# Patient Record
Sex: Male | Born: 1960 | Race: Black or African American | Hispanic: No | Marital: Married | Smoking: Never smoker
Health system: Southern US, Community
[De-identification: ages and names within clinical notes are randomized; demographics above are authoritative.]

## PROBLEM LIST (undated history)

## (undated) DIAGNOSIS — D8689 Sarcoidosis of other sites: Secondary | ICD-10-CM

## (undated) DIAGNOSIS — I2510 Atherosclerotic heart disease of native coronary artery without angina pectoris: Secondary | ICD-10-CM

## (undated) DIAGNOSIS — I214 Non-ST elevation (NSTEMI) myocardial infarction: Secondary | ICD-10-CM

## (undated) DIAGNOSIS — E876 Hypokalemia: Secondary | ICD-10-CM

## (undated) DIAGNOSIS — E785 Hyperlipidemia, unspecified: Secondary | ICD-10-CM

## (undated) DIAGNOSIS — K219 Gastro-esophageal reflux disease without esophagitis: Secondary | ICD-10-CM

## (undated) DIAGNOSIS — I10 Essential (primary) hypertension: Secondary | ICD-10-CM

## (undated) HISTORY — DX: Non-ST elevation (NSTEMI) myocardial infarction: I21.4

## (undated) HISTORY — DX: Hypokalemia: E87.6

## (undated) HISTORY — DX: Sarcoidosis of other sites: D86.89

## (undated) HISTORY — PX: KNEE SURGERY: SHX244

## (undated) HISTORY — PX: PERCUTANEOUS CORONARY STENT INTERVENTION (PCI-S): SHX6016

---

## 1999-06-06 ENCOUNTER — Other Ambulatory Visit: Admission: RE | Admit: 1999-06-06 | Discharge: 1999-06-06 | Payer: Self-pay | Admitting: Otolaryngology

## 1999-07-26 ENCOUNTER — Other Ambulatory Visit: Admission: RE | Admit: 1999-07-26 | Discharge: 1999-07-26 | Payer: Self-pay | Admitting: Otolaryngology

## 2002-08-02 ENCOUNTER — Emergency Department (HOSPITAL_COMMUNITY): Admission: EM | Admit: 2002-08-02 | Discharge: 2002-08-03 | Payer: Self-pay | Admitting: Emergency Medicine

## 2009-04-03 ENCOUNTER — Emergency Department (HOSPITAL_COMMUNITY): Admission: EM | Admit: 2009-04-03 | Discharge: 2009-04-03 | Payer: Self-pay | Admitting: Emergency Medicine

## 2010-12-10 ENCOUNTER — Encounter: Payer: Self-pay | Admitting: Family Medicine

## 2014-01-28 ENCOUNTER — Encounter (HOSPITAL_COMMUNITY): Payer: Self-pay | Admitting: Emergency Medicine

## 2014-01-28 ENCOUNTER — Encounter (HOSPITAL_COMMUNITY)
Admission: EM | Disposition: A | Discharge status: Home / Self Care | Payer: BC Managed Care – PPO | Source: Home / Self Care | Attending: Cardiology

## 2014-01-28 ENCOUNTER — Emergency Department (HOSPITAL_COMMUNITY): Payer: BC Managed Care – PPO

## 2014-01-28 ENCOUNTER — Inpatient Hospital Stay (HOSPITAL_COMMUNITY)
Admission: EM | Admit: 2014-01-28 | Discharge: 2014-01-30 | DRG: 247 | Disposition: A | Discharge status: Home / Self Care | Payer: BC Managed Care – PPO | Attending: Cardiology | Admitting: Cardiology

## 2014-01-28 DIAGNOSIS — Z79899 Other long term (current) drug therapy: Secondary | ICD-10-CM

## 2014-01-28 DIAGNOSIS — E876 Hypokalemia: Secondary | ICD-10-CM

## 2014-01-28 DIAGNOSIS — Z955 Presence of coronary angioplasty implant and graft: Secondary | ICD-10-CM

## 2014-01-28 DIAGNOSIS — I214 Non-ST elevation (NSTEMI) myocardial infarction: Secondary | ICD-10-CM

## 2014-01-28 DIAGNOSIS — R509 Fever, unspecified: Secondary | ICD-10-CM

## 2014-01-28 DIAGNOSIS — E785 Hyperlipidemia, unspecified: Secondary | ICD-10-CM | POA: Diagnosis present

## 2014-01-28 DIAGNOSIS — I2510 Atherosclerotic heart disease of native coronary artery without angina pectoris: Secondary | ICD-10-CM

## 2014-01-28 DIAGNOSIS — I252 Old myocardial infarction: Secondary | ICD-10-CM | POA: Diagnosis present

## 2014-01-28 DIAGNOSIS — I2582 Chronic total occlusion of coronary artery: Secondary | ICD-10-CM | POA: Diagnosis present

## 2014-01-28 DIAGNOSIS — I10 Essential (primary) hypertension: Secondary | ICD-10-CM

## 2014-01-28 HISTORY — DX: Atherosclerotic heart disease of native coronary artery without angina pectoris: I25.10

## 2014-01-28 HISTORY — DX: Essential (primary) hypertension: I10

## 2014-01-28 HISTORY — PX: LEFT HEART CATHETERIZATION WITH CORONARY ANGIOGRAM: SHX5451

## 2014-01-28 HISTORY — PX: PERCUTANEOUS STENT INTERVENTION: SHX5500

## 2014-01-28 HISTORY — DX: Hyperlipidemia, unspecified: E78.5

## 2014-01-28 LAB — I-STAT TROPONIN, ED
TROPONIN I, POC: 1.68 ng/mL — AB (ref 0.00–0.08)
Troponin i, poc: 2.42 ng/mL (ref 0.00–0.08)

## 2014-01-28 LAB — HEPARIN LEVEL (UNFRACTIONATED): Heparin Unfractionated: <0.1 IU/mL — ABNORMAL LOW (ref 0.30–0.70)

## 2014-01-28 LAB — COMPREHENSIVE METABOLIC PANEL
ALT: 38 U/L (ref 0–53)
AST: 75 U/L — ABNORMAL HIGH (ref 0–37)
Albumin: 4 g/dL (ref 3.5–5.2)
Alkaline Phosphatase: 86 U/L (ref 39–117)
BUN: 10 mg/dL (ref 6–23)
CALCIUM: 9.3 mg/dL (ref 8.4–10.5)
CO2: 26 mEq/L (ref 19–32)
Chloride: 98 mEq/L (ref 96–112)
Creatinine, Ser: 0.92 mg/dL (ref 0.50–1.35)
GFR calc non Af Amer: >90 mL/min (ref >90)
Glucose, Bld: 116 mg/dL — ABNORMAL HIGH (ref 70–99)
Potassium: 3.3 mEq/L — ABNORMAL LOW (ref 3.7–5.3)
Sodium: 138 mEq/L (ref 137–147)
TOTAL PROTEIN: 7.7 g/dL (ref 6.0–8.3)
Total Bilirubin: 1.3 mg/dL — ABNORMAL HIGH (ref 0.3–1.2)

## 2014-01-28 LAB — CBC
HCT: 44.1 % (ref 39.0–52.0)
Hemoglobin: 15.4 g/dL (ref 13.0–17.0)
MCH: 27.8 pg (ref 26.0–34.0)
MCHC: 34.9 g/dL (ref 30.0–36.0)
MCV: 79.6 fL (ref 78.0–100.0)
PLATELETS: 248 10*3/uL (ref 150–400)
RBC: 5.54 MIL/uL (ref 4.22–5.81)
RDW: 12.7 % (ref 11.5–15.5)
WBC: 4.9 10*3/uL (ref 4.0–10.5)

## 2014-01-28 LAB — RAPID URINE DRUG SCREEN, HOSP PERFORMED
AMPHETAMINES: NOT DETECTED
Barbiturates: NOT DETECTED
Benzodiazepines: POSITIVE — AB
COCAINE: NOT DETECTED
OPIATES: NOT DETECTED
Tetrahydrocannabinol: NOT DETECTED

## 2014-01-28 LAB — POCT ACTIVATED CLOTTING TIME: ACTIVATED CLOTTING TIME: 454 s

## 2014-01-28 LAB — URINALYSIS, ROUTINE W REFLEX MICROSCOPIC
BILIRUBIN URINE: NEGATIVE
Glucose, UA: NEGATIVE mg/dL
HGB URINE DIPSTICK: NEGATIVE
Ketones, ur: NEGATIVE mg/dL
Leukocytes, UA: NEGATIVE
NITRITE: NEGATIVE
PH: 6.5 (ref 5.0–8.0)
Protein, ur: NEGATIVE mg/dL
SPECIFIC GRAVITY, URINE: 1.008 (ref 1.005–1.030)
Urobilinogen, UA: 0.2 mg/dL (ref 0.0–1.0)

## 2014-01-28 LAB — APTT: APTT: 28 s (ref 24–37)

## 2014-01-28 LAB — MRSA PCR SCREENING: MRSA by PCR: NEGATIVE

## 2014-01-28 LAB — PROTIME-INR
INR: 1.01 (ref 0.00–1.49)
Prothrombin Time: 13.1 seconds (ref 11.6–15.2)

## 2014-01-28 SURGERY — LEFT HEART CATHETERIZATION WITH CORONARY ANGIOGRAM
Anesthesia: LOCAL

## 2014-01-28 MED ORDER — BIVALIRUDIN 250 MG IV SOLR
0.2500 mg/kg/h | INTRAVENOUS | Status: DC
  Administered 2014-01-28: 0.25 mg/kg/h via INTRAVENOUS
  Filled 2014-01-28: qty 250

## 2014-01-28 MED ORDER — TICAGRELOR 90 MG PO TABS
ORAL_TABLET | ORAL | Status: AC
  Filled 2014-01-28: qty 2

## 2014-01-28 MED ORDER — SODIUM CHLORIDE 0.9 % IV SOLN: 250.0000 mL | INTRAVENOUS | Status: DC | PRN

## 2014-01-28 MED ORDER — HEPARIN SODIUM (PORCINE) 5000 UNIT/ML IJ SOLN
4000.0000 [IU] | Freq: Once | INTRAMUSCULAR | Status: AC
  Administered 2014-01-28: 4000 [IU] via INTRAVENOUS
  Filled 2014-01-28: qty 1

## 2014-01-28 MED ORDER — HEPARIN (PORCINE) IN NACL 2-0.9 UNIT/ML-% IJ SOLN
INTRAMUSCULAR | Status: AC
  Filled 2014-01-28: qty 1500

## 2014-01-28 MED ORDER — BIVALIRUDIN 250 MG IV SOLR
INTRAVENOUS | Status: AC
  Filled 2014-01-28: qty 250

## 2014-01-28 MED ORDER — TICAGRELOR 90 MG PO TABS
90.0000 mg | ORAL_TABLET | Freq: Two times a day (BID) | ORAL | Status: DC
  Administered 2014-01-29 – 2014-01-30 (×3): 90 mg via ORAL
  Filled 2014-01-28 (×4): qty 1

## 2014-01-28 MED ORDER — SODIUM CHLORIDE 0.9 % IV SOLN: 1000.0000 mL | INTRAVENOUS | Status: DC

## 2014-01-28 MED ORDER — SODIUM CHLORIDE 0.9 % IV SOLN
INTRAVENOUS | Status: DC
  Administered 2014-01-28: 22:00:00 via INTRAVENOUS

## 2014-01-28 MED ORDER — NITROGLYCERIN 0.4 MG SL SUBL
0.4000 mg | SUBLINGUAL_TABLET | SUBLINGUAL | Status: DC | PRN
Start: 2014-01-28 — End: 2014-01-30

## 2014-01-28 MED ORDER — NITROGLYCERIN 0.2 MG/ML ON CALL CATH LAB
INTRAVENOUS | Status: AC
  Filled 2014-01-28: qty 1

## 2014-01-28 MED ORDER — ASPIRIN 81 MG PO CHEW: 81.0000 mg | CHEWABLE_TABLET | ORAL | Status: DC

## 2014-01-28 MED ORDER — NITROGLYCERIN IN D5W 200-5 MCG/ML-% IV SOLN
5.0000 ug/min | INTRAVENOUS | Status: DC
  Administered 2014-01-28: 5 ug/min via INTRAVENOUS
  Filled 2014-01-28: qty 250

## 2014-01-28 MED ORDER — SODIUM CHLORIDE 0.9 % IV SOLN
1000.0000 mL | INTRAVENOUS | Status: DC
  Administered 2014-01-28: 1000 mL via INTRAVENOUS

## 2014-01-28 MED ORDER — HEPARIN SODIUM (PORCINE) 1000 UNIT/ML IJ SOLN
INTRAMUSCULAR | Status: AC
Start: 2014-01-28 — End: 2014-01-28
  Filled 2014-01-28: qty 1

## 2014-01-28 MED ORDER — SODIUM CHLORIDE 0.9 % IJ SOLN: 3.0000 mL | INTRAMUSCULAR | Status: DC | PRN

## 2014-01-28 MED ORDER — METOPROLOL TARTRATE 25 MG PO TABS
25.0000 mg | ORAL_TABLET | Freq: Two times a day (BID) | ORAL | Status: DC
  Administered 2014-01-28 – 2014-01-30 (×4): 25 mg via ORAL
  Filled 2014-01-28 (×5): qty 1

## 2014-01-28 MED ORDER — MIDAZOLAM HCL 2 MG/2ML IJ SOLN
INTRAMUSCULAR | Status: AC
Start: 2014-01-28 — End: 2014-01-28
  Filled 2014-01-28: qty 2

## 2014-01-28 MED ORDER — FENTANYL CITRATE 0.05 MG/ML IJ SOLN
INTRAMUSCULAR | Status: AC
  Filled 2014-01-28: qty 2

## 2014-01-28 MED ORDER — ATORVASTATIN CALCIUM 80 MG PO TABS
80.0000 mg | ORAL_TABLET | Freq: Every day | ORAL | Status: DC
  Administered 2014-01-29: 80 mg via ORAL
  Filled 2014-01-28 (×2): qty 1

## 2014-01-28 MED ORDER — MIDAZOLAM HCL 2 MG/2ML IJ SOLN
INTRAMUSCULAR | Status: AC
  Filled 2014-01-28: qty 2

## 2014-01-28 MED ORDER — ASPIRIN EC 81 MG PO TBEC
81.0000 mg | DELAYED_RELEASE_TABLET | Freq: Every day | ORAL | Status: DC
Start: 2014-01-29 — End: 2014-01-30
  Administered 2014-01-29 – 2014-01-30 (×2): 81 mg via ORAL
  Filled 2014-01-28 (×2): qty 1

## 2014-01-28 MED ORDER — METOPROLOL TARTRATE 1 MG/ML IV SOLN
5.0000 mg | Freq: Once | INTRAVENOUS | Status: AC
  Administered 2014-01-28: 5 mg via INTRAVENOUS
  Filled 2014-01-28: qty 5

## 2014-01-28 MED ORDER — LIDOCAINE HCL (PF) 1 % IJ SOLN
INTRAMUSCULAR | Status: AC
  Filled 2014-01-28: qty 30

## 2014-01-28 MED ORDER — HEPARIN (PORCINE) IN NACL 100-0.45 UNIT/ML-% IJ SOLN: 14.0000 [IU]/kg/h | Freq: Once | INTRAMUSCULAR | Status: DC

## 2014-01-28 MED ORDER — POTASSIUM CHLORIDE 20 MEQ/15ML (10%) PO LIQD
ORAL | Status: AC
  Filled 2014-01-28: qty 30

## 2014-01-28 MED ORDER — HEPARIN (PORCINE) IN NACL 100-0.45 UNIT/ML-% IJ SOLN
1150.0000 [IU]/h | INTRAMUSCULAR | Status: DC
  Administered 2014-01-28: 1150 [IU]/h via INTRAVENOUS
  Filled 2014-01-28: qty 250

## 2014-01-28 MED ORDER — VERAPAMIL HCL 2.5 MG/ML IV SOLN
INTRAVENOUS | Status: AC
  Filled 2014-01-28: qty 2

## 2014-01-28 MED ORDER — HEPARIN (PORCINE) IN NACL 100-0.45 UNIT/ML-% IJ SOLN: 12.0000 [IU]/kg/h | INTRAMUSCULAR | Status: DC

## 2014-01-28 MED ORDER — NITROGLYCERIN 0.4 MG SL SUBL: 0.4000 mg | SUBLINGUAL_TABLET | SUBLINGUAL | Status: DC | PRN

## 2014-01-28 MED ORDER — ASPIRIN 81 MG PO CHEW
324.0000 mg | CHEWABLE_TABLET | Freq: Once | ORAL | Status: AC
  Administered 2014-01-28: 324 mg via ORAL
  Filled 2014-01-28: qty 4

## 2014-01-28 MED ORDER — POTASSIUM CHLORIDE CRYS ER 20 MEQ PO TBCR
40.0000 meq | EXTENDED_RELEASE_TABLET | Freq: Once | ORAL | Status: DC
  Filled 2014-01-28: qty 2

## 2014-01-28 MED ORDER — NITROGLYCERIN IN D5W 200-5 MCG/ML-% IV SOLN: 2.0000 ug/min | INTRAVENOUS | Status: DC

## 2014-01-28 MED ORDER — ASPIRIN EC 81 MG PO TBEC: 81.0000 mg | DELAYED_RELEASE_TABLET | Freq: Every day | ORAL | Status: DC

## 2014-01-28 MED ORDER — VERAPAMIL HCL 2.5 MG/ML IV SOLN
INTRAVENOUS | Status: AC
Start: 2014-01-28 — End: 2014-01-28
  Filled 2014-01-28: qty 2

## 2014-01-28 MED ORDER — SODIUM CHLORIDE 0.9 % IJ SOLN
3.0000 mL | Freq: Two times a day (BID) | INTRAMUSCULAR | Status: DC
Start: 2014-01-28 — End: 2014-01-28

## 2014-01-28 NOTE — Interval H&P Note (Signed)
Cath Lab Visit (complete for each Cath Lab visit)  Clinical Evaluation Leading to the Procedure:   ACS: yes  Non-ACS:    Anginal Classification: CCS IV  Anti-ischemic medical therapy: No Therapy  Non-Invasive Test Results: No non-invasive testing performed  Prior CABG: No previous CABG      History and Physical Interval Note:  01/28/2014 4:06 PM  Bryan Torres  has presented today for surgery, with the diagnosis of CP  The various methods of treatment have been discussed with the patient and family. After consideration of risks, benefits and other options for treatment, the patient has consented to  Procedure(s): LEFT HEART CATHETERIZATION WITH CORONARY ANGIOGRAM (N/A) as a surgical intervention .  The patient's history has been reviewed, patient examined, no change in status, stable for surgery.  I have reviewed the patient's chart and labs.  Questions were answered to the patient's satisfaction.     Bryan Torres

## 2014-01-28 NOTE — ED Notes (Signed)
Dr. Zackowski at bedside  

## 2014-01-28 NOTE — ED Notes (Signed)
Lab called to add on APTT and PT-INR to draw and hold light blue top. Shelia from lab sts has tube and will add on

## 2014-01-28 NOTE — CV Procedure (Addendum)
    Cardiac Catheterization Procedure Note  Name: Bryan Torres MRN: [id number] DOB: Sep 26, 1961  Procedure: Left Heart Cath, Selective Coronary Angiography, LV angiography  Indication: NSTEMI   Procedural Details: Allen's test was negative.  The right wrist was prepped, draped, and anesthetized with 1% lidocaine. Using the modified Seldinger technique, a 5 French sheath was introduced into the right radial artery. 3 mg of verapamil was administered through the sheath, weight-based unfractionated heparin was administered intravenously. Standard Judkins catheters were used for selective coronary angiography and left ventriculography. Catheter exchanges were performed over an exchange length guidewire. There were no immediate procedural complications. A TR band was used for radial hemostasis at the completion of the procedure.  The patient was transferred to the post catheterization recovery area for further monitoring.  Procedural Findings: Hemodynamics: AO 123/84 LV 131/22  Coronary angiography: Coronary dominance: codominant  Left mainstem: No significant disease.   Left anterior descending (LAD): Mild luminal irregularities.  Left circumflex (LCx): Codominant vessel.  Small ramus with 40% ostial narrowing. There was total occlusion of the distal LCx after a moderate PLOM.    Right coronary artery (RCA): Codominant vessel.  No significant disease.   Left ventriculography: Left ventricular systolic function is normal, LVEF is estimated at 60-65%, there was basal inferior severe hypokinesis.   Final Conclusions:  Culprit lesion is total occlusion of the distal LCx.  There is an associated wall motion abnormality.  I discussed patient with Dr. Claiborne Billings.  We will proceed with PCI after ticagrelor load.   Bryan Torres 01/28/2014, 5:20 PM

## 2014-01-28 NOTE — ED Notes (Signed)
Results of troponin given to Dr. Rogene Houston and shown to [place], [place]

## 2014-01-28 NOTE — ED Provider Notes (Addendum)
CSN: [id number]     Arrival date & time 01/28/14  1122 History   First MD Initiated Contact with Patient 01/28/14 1158     Chief Complaint  Patient presents with  . Chest Pain     (Consider location/radiation/quality/duration/timing/severity/associated sxs/prior Treatment) Patient is a 53 y.o. male presenting with chest pain. The history is provided by the patient.  Chest Pain Associated symptoms: nausea   Associated symptoms: no abdominal pain, no back pain, no fever, no headache, no shortness of breath and not vomiting    patient himself in. Patient with onset of chest pain last evening at 9:30 is been constant sternal left-sided chest nonradiating. Patient increased to 10 out of 10 at midnight currently 2/10. Associated with nausea but no vomiting no shortness of breath. No history of similar complaint. Patient felt that it was related to indigestion. Patient was having a lot of burping.  Past Medical History  Diagnosis Date  . Hypertension    Past Surgical History  Procedure Laterality Date  . Knee surgery Right     reconstructive   No family history on file. History  Substance Use Topics  . Smoking status: Never Smoker   . Smokeless tobacco: Not on file  . Alcohol Use: Yes     Comment: occ    Review of Systems  Constitutional: Negative for fever.  HENT: Negative for congestion.   Eyes: Negative for visual disturbance.  Respiratory: Negative for shortness of breath.   Cardiovascular: Positive for chest pain.  Gastrointestinal: Positive for nausea. Negative for vomiting and abdominal pain.  Genitourinary: Negative for dysuria.  Musculoskeletal: Negative for back pain.  Skin: Negative for rash.  Neurological: Negative for syncope and headaches.  Hematological: Does not bruise/bleed easily.  Psychiatric/Behavioral: Negative for confusion.      Allergies  Review of patient's allergies indicates no known allergies.  Home Medications  No current outpatient  prescriptions on file. BP 169/99  Pulse 67  Temp(Src) 99.1 F (37.3 C) (Oral)  Resp 18  Ht 5' 8.5" (1.74 m)  Wt 208 lb (94.348 kg)  BMI 31.16 kg/m2  SpO2 96% Physical Exam  Nursing note and vitals reviewed. Constitutional: He is oriented to person, place, and time. He appears well-developed and well-nourished. No distress.  HENT:  Head: Normocephalic and atraumatic.  Mouth/Throat: Oropharynx is clear and moist.  Eyes: Conjunctivae and EOM are normal.  Neck: Normal range of motion.  Cardiovascular: Normal rate, regular rhythm and normal heart sounds.   No murmur heard. Pulmonary/Chest: Effort normal and breath sounds normal. No respiratory distress.  Abdominal: Soft. Bowel sounds are normal. There is no tenderness.  Musculoskeletal: Normal range of motion. He exhibits no edema.  Neurological: He is alert and oriented to person, place, and time. No cranial nerve deficit. He exhibits normal muscle tone. Coordination normal.  Skin: Skin is warm.    ED Course  Procedures (including critical care time) [place], ED - Abnormal; Notable for the following:    Troponin i, poc 1.68 (*)    All other components within normal limits  CBC  COMPREHENSIVE METABOLIC PANEL  APTT  URINALYSIS, ROUTINE W REFLEX MICROSCOPIC  PROTIME-INR   Imaging Review Dg Chest 2 View  01/28/2014   CLINICAL DATA:  Epigastric chest pain  EXAM: CHEST  2 VIEW  COMPARISON:  None.  FINDINGS: The heart size and mediastinal contours are within normal limits. Both lungs are clear. The visualized skeletal structures are unremarkable.  IMPRESSION: No active  cardiopulmonary disease.   Electronically Signed   By: Jorje Guild M.D.   On: 01/28/2014 12:03     EKG Interpretation   Date/Time:  Thursday January 28 2014 11:27:35 EDT Ventricular Rate:  71 PR Interval:  168 QRS Duration: 80 QT Interval:  414 QTC Calculation: 449 R Axis:   82 Text Interpretation:  Normal sinus rhythm  with sinus arrhythmia T wave  abnormality, consider inferior ischemia Abnormal ECG Confirmed by  Yaniyah Koors  MD, Jamell Laymon ([id number]) on 01/28/2014 12:31:52 PM     Some artifact in the inferior leads but no distinct ST segment elevation. T wave inversion laterally.   CRITICAL CARE Performed by: Mervin Kung. Total critical care time: 30 Critical care time was exclusive of separately billable procedures and treating other patients. Critical care was necessary to treat or prevent imminent or life-threatening deterioration. Critical care was time spent personally by me on the following activities: development of treatment plan with patient and/or surrogate as well as nursing, discussions with consultants, evaluation of patient's response to treatment, examination of patient, obtaining history from patient or surrogate, ordering and performing treatments and interventions, ordering and review of laboratory studies, ordering and review of radiographic studies, pulse oximetry and re-evaluation of patient's condition.   MDM   Final diagnoses:  NSTEMI (non-ST elevated myocardial infarction)    Patient with onset of of substernal left-sided chest pain at 9:30 last evening when up to a 10 out of 10 at midnight. Currently 2/10. Patient is not any aspirin today. Patient with history of hypertension no coronary artery disease history. Patient's a nonsmoker. Family history negative for early coronary artery disease. Patient with persistent discomfort. Troponin elevated. EKG with T-wave inversion laterally some wandering baseline inferiorly but no evidence of ST segment elevation. Based on the troponin patient's a non-STEMI. IV started heparin started patient begin aspirin patient to get nitroglycerin. Cardiology contacted and they will be coming to see the patient.    Mervin Kung, MD 01/28/14 1255   I still awaiting cardiology. The patient stable. Patient's second 20 care troponin was 2.4 to. This  is doubled from the first one confirmatory for MI.  Mervin Kung, MD 01/28/14 [phone]

## 2014-01-28 NOTE — CV Procedure (Addendum)
Bryan Torres is a 53 y.o. male    [id number]  [id number] LOCATION:  FACILITY: [facility]  PHYSICIAN: Troy Sine, MD, [facility] 1961-08-11   DATE OF PROCEDURE:  01/28/2014    CARDIAC CATHETERIZATION     HISTORY:   Mr. Bryan Torres is a 53 year old African American male who developed new-onset chest pain last evening after playing tennis. He continued to experience recurrent chest pain today. He presented to [facility] and troponin was positive. Diagnostic catheterization was done via the right radial approach by Dr. Aundra Dubin. 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  I was consulted to attempt percutaneous coronary intervention.   PROCEDURE:  At the start of the interventional procedure, the patient received additional 1 mg of Versed and 25 mcg of fentanyl. Bivalrudin bolus plus infusion was administered and the patient received 180 mg of Brilinta upon completion of the catheterization study done by [facility]. The right radial sheath was in place from the diagnostic procedure. He received additional 3 mg verapamil to reduce potential spasm. Initially, a 6 [place] Vista guide XP 3.5 was inserted over a safety J. Wire but this was unable to be navigated to the descending aorta. This was then removed and replaced with a 5 [place] JR 4 over a first core wire and was advanced to the ascending aorta. This was then exchanged over a safety J. Wire for the exit B3 0.5 catheter. This was unable to cannulate the left main and ultimately was replaced with a JL 3.5. At this point, the patient was having additional chest pain and was noted to have ST elevation in his inferior leads. This catheter was able to selectively engage the left main and scattered angiography. Total occlusion of the AV groove circumflex after the second marginal branch. Unfortunately, this guide ultimately was unable to stay cannulated into the LAD. 2 additional attempts were made and a  Terumo L3 0.5, and XP LAD 3.5, which also were unsuccessful. A 3.5 guide was then reinserted and at this point there seemed to be intense spasm leading to difficulty in advancing the catheter despite  having previously received an additional 3 mg of verapamil and the catheter was removed. An additional 100 mcg intravenous nitroglycerin was also administered into the sheath. There was significant spasm over the remaining wire such that this was not able to be initially removed. At this point, the patient received an additional cocktail consisting of 200 mcg of nitroglycerin, 2 mg of verapamil and additional lidocaine. The decision was made to convert to the femoral approach. The right groin was prepped and shaved in the usual fashion. The right femoral artery was punctured anteriorly and a 6 French sheath was inserted without difficulty. Ultimately, with time and with the additional radial cocktails, the wire was able to be removed from the radial sheath successfully. A 6 French XB LAD 3.5 guide was ultimately use for the procedure from the femoral approach. In a stocking medium wire was advanced down the circumflex and was able to cross the total occlusion the dilatation was done with a 2.0x12 mm balloon just beyond the second major marginal branch. Once flow was reestablished there were 3 additional branch branches that were visualized including one larger marginal branch and an 2 more distal branches. 3 dilatations were done with a 2.0x12 mm Emerge. Due to the larger marginal branch arising a short distant so as not to jail this a Xience Alpine 2.25x8 mm DES stent was  inserted at deployed immediately beyond the second marginal branch and did not jail the now open third large marginal branch. This was dilated x2 at 10 and 11 atmospheres. A 2.5x6 mm noncompliant tract was used for post stent dilatation to 2.30 mm. Angiography confirmed an excellent angiographic result. He left the catheterization laboratory chest  pain-free with stable hemodynamics and resolution of his prior transient ST elevation.   HEMODYNAMICS:   Central Aorta: 120/80     ANGIOGRAPHY:  Please refer to Dr. Osie Cheeks diagnostic catheterization report.  At the start of the intervention, the left circumflex coronary artery is a dominant vessel that gave rise to 2 major obtuse marginal branches and then was occluded immediately after the second major marginal branch. Following successful PCI with additional 2.0x12 mm Amerge balloon and ultimate stenting 100% occlusion with a science Alpine 2.25 mm x8 mm DES stent, postdilated to 2.3 mm, the 100% occlusion was reduced to 0% there was resumption of TIMI 3 flow. Just beyond the stent there was a moderate size third marginal branch and then after this were 2 additional smaller branches supplying the posterolateral and inferolateral myocardium.  IMPRESSION:  Successful percutaneous coronary intervention the distal left circumflex coronary artery with the 100% occlusion of the AV groove after the second marginal vessel being reduced to 0% reestablishment of flow to 3 additional branches of the distal circumflex which were beyond this occluded segment.  Bivalirudin/180 mg oral Kelton Pillar, MD, [facility] 01/28/2014 9:25 PM

## 2014-01-28 NOTE — H&P (Signed)
Bryan Torres is an 53 y.o. male.   Chief Complaint:  Chest pain HPI:   The patientis a 53 yo AA male who works as a Information systems manager at [employer].  He has a history of HTN.  No tobacco abuse.  He reports playing a tennis match yesterday without difficulties but later that evening around 2130hrs, he developed "indigestion".  He was burping some, which decreased the discomfort.  He was also nauseated a little.  Around 2400hrs the pain became severe 8-10/10 with "tingling' in his left arm.  He took two 62m ASA and tums.  At 0200hrs the pain eased enough for him to fall asleep.  He woke to the continued chest pressure of 2-3/10.  He went to work and the pain increased.  Took advil and decided he better get it checked out.  The patient currently denies nausea, vomiting, fever, shortness of breath, orthopnea, dizziness, PND, cough, congestion, abdominal pain, hematochezia, melena, lower extremity edema, claudication.  Medications: Prior to Admission medications   Medication Sig Start Date End Date Taking? Authorizing Provider  hydrochlorothiazide (HYDRODIURIL) 25 MG tablet Take 12.5 mg by mouth daily.   Yes Historical Provider, MD  ibuprofen (ADVIL,MOTRIN) 200 MG tablet Take 200-400 mg by mouth every 6 (six) hours as needed for fever, headache or mild pain.   Yes Historical Provider, MD  Ranitidine HCl (ACID REDUCER PO) Take 1-2 tablets by mouth daily as needed (stomach acid).   Yes Historical Provider, MD     Past Medical History  Diagnosis Date  . Hypertension     Past Surgical History  Procedure Laterality Date  . Knee surgery Right     reconstructive    Family History:  No history of CAD  Social History:  reports that he has never smoked. He does not have any smokeless tobacco history on file. He reports that he drinks alcohol. He reports that he does not use illicit drugs.  Allergies: No Known Allergies   (Not in a hospital admission)  Results for orders placed during the  hospital encounter of 01/28/14 (from the past 48 hour(s))  CBC     Status: None   Collection Time    01/28/14 12:07 PM      Result Value Ref Range   WBC 4.9  4.0 - 10.5 K/uL   RBC 5.54  4.22 - 5.81 MIL/uL   Hemoglobin 15.4  13.0 - 17.0 g/dL   HCT 44.1  39.0 - 52.0 %   MCV 79.6  78.0 - 100.0 fL   MCH 27.8  26.0 - 34.0 pg   MCHC 34.9  30.0 - 36.0 g/dL   RDW 12.7  11.5 - 15.5 %   Platelets 248  150 - 400 K/uL  COMPREHENSIVE METABOLIC PANEL     Status: Abnormal   Collection Time    01/28/14 12:07 PM      Result Value Ref Range   Sodium 138  137 - 147 mEq/L   Potassium 3.3 (*) 3.7 - 5.3 mEq/L   Chloride 98  96 - 112 mEq/L   CO2 26  19 - 32 mEq/L   Glucose, Bld 116 (*) 70 - 99 mg/dL   BUN 10  6 - 23 mg/dL   Creatinine, Ser 0.92  0.50 - 1.35 mg/dL   Calcium 9.3  8.4 - 10.5 mg/dL   Total Protein 7.7  6.0 - 8.3 g/dL   Albumin 4.0  3.5 - 5.2 g/dL   AST 75 (*) 0 -  37 U/L   ALT 38  0 - 53 U/L   Alkaline Phosphatase 86  39 - 117 U/L   Total Bilirubin 1.3 (*) 0.3 - 1.2 mg/dL   GFR calc non Af Amer >90  >90 mL/min   GFR calc Af Amer >90  >90 mL/min   Comment: (NOTE)     The eGFR has been calculated using the CKD EPI equation.     This calculation has not been validated in all clinical situations.     eGFR's persistently <90 mL/min signify possible Chronic Kidney     Disease.  APTT     Status: None   Collection Time    01/28/14 12:07 PM      Result Value Ref Range   aPTT 28  24 - 37 seconds  PROTIME-INR     Status: None   Collection Time    01/28/14 12:07 PM      Result Value Ref Range   Prothrombin Time 13.1  11.6 - 15.2 seconds   INR 1.01  0.00 - 1.49  HEPARIN LEVEL (UNFRACTIONATED)     Status: Abnormal   Collection Time    01/28/14 12:07 PM      Result Value Ref Range   Heparin Unfractionated <0.10 (*) 0.30 - 0.70 IU/mL   Comment:            IF HEPARIN RESULTS ARE BELOW     EXPECTED VALUES, AND PATIENT     DOSAGE HAS BEEN CONFIRMED,     SUGGEST FOLLOW UP TESTING     OF  ANTITHROMBIN III LEVELS.     REPEATED TO VERIFY  I-STAT [place], ED     Status: Abnormal   Collection Time    01/28/14 12:14 PM      Result Value Ref Range   Troponin i, poc 1.68 (*) 0.00 - 0.08 ng/mL   Comment NOTIFIED PHYSICIAN     Comment 3            Comment: Due to the release kinetics of cTnI,     a negative result within the first hours     of the onset of symptoms does not rule out     myocardial infarction with certainty.     If myocardial infarction is still suspected,     repeat the test at appropriate intervals.  URINALYSIS, ROUTINE W REFLEX MICROSCOPIC     Status: None   Collection Time    01/28/14 12:48 PM      Result Value Ref Range   Color, Urine YELLOW  YELLOW   APPearance CLEAR  CLEAR   Specific Gravity, Urine 1.008  1.005 - 1.030   pH 6.5  5.0 - 8.0   Glucose, UA NEGATIVE  NEGATIVE mg/dL   Hgb urine dipstick NEGATIVE  NEGATIVE   Bilirubin Urine NEGATIVE  NEGATIVE   Ketones, ur NEGATIVE  NEGATIVE mg/dL   Protein, ur NEGATIVE  NEGATIVE mg/dL   Urobilinogen, UA 0.2  0.0 - 1.0 mg/dL   Nitrite NEGATIVE  NEGATIVE   Leukocytes, UA NEGATIVE  NEGATIVE   Comment: MICROSCOPIC NOT DONE ON URINES WITH NEGATIVE PROTEIN, BLOOD, LEUKOCYTES, NITRITE, OR GLUCOSE <1000 mg/dL.  Randolm Idol, ED     Status: Abnormal   Collection Time    01/28/14  1:52 PM      Result Value Ref Range   Troponin i, poc 2.42 (*) 0.00 - 0.08 ng/mL   Comment NOTIFIED PHYSICIAN     Comment 3  Comment: Due to the release kinetics of cTnI,     a negative result within the first hours     of the onset of symptoms does not rule out     myocardial infarction with certainty.     If myocardial infarction is still suspected,     repeat the test at appropriate intervals.   Dg Chest 2 View  01/28/2014   CLINICAL DATA:  Epigastric chest pain  EXAM: CHEST  2 VIEW  COMPARISON:  None.  FINDINGS: The heart size and mediastinal contours are within normal limits. Both lungs are clear. The  visualized skeletal structures are unremarkable.  IMPRESSION: No active cardiopulmonary disease.   Electronically Signed   By: Jorje Guild M.D.   On: 01/28/2014 12:03    Review of Systems  Constitutional: Negative for fever and diaphoresis.  HENT: Positive for sore throat. Negative for congestion.   Respiratory: Negative for cough, hemoptysis, shortness of breath and wheezing.   Cardiovascular: Positive for chest pain. Negative for palpitations, orthopnea, leg swelling and PND.  Gastrointestinal: Positive for nausea. Negative for vomiting, abdominal pain, blood in stool and melena.  Genitourinary: Negative for hematuria.  Musculoskeletal: Positive for myalgias (He reports "tingling" in the left arm).  Neurological: Negative for dizziness.  All other systems reviewed and are negative.    Blood pressure 142/85, pulse 68, temperature 99.1 F (37.3 C), temperature source Oral, resp. rate 19, height 5' 8.5" (1.74 m), weight 208 lb (94.348 kg), SpO2 99.00%. Physical Exam  Nursing note and vitals reviewed. Constitutional: He is oriented to person, place, and time. He appears well-developed and well-nourished. No distress.  HENT:  Head: Normocephalic and atraumatic.  Mouth/Throat: No oropharyngeal exudate.  Eyes: EOM are normal. Pupils are equal, round, and reactive to light. No scleral icterus.  Neck: Normal range of motion. Neck supple. No JVD present.  Cardiovascular: Normal rate, regular rhythm, S1 normal and S2 normal.   No murmur heard. Pulses:      Radial pulses are 2+ on the right side, and 2+ on the left side.       Dorsalis pedis pulses are 2+ on the right side, and 2+ on the left side.  No carotid Bruits.  Respiratory: Effort normal and breath sounds normal. He has no wheezes. He has no rales. He exhibits no tenderness.  GI: Soft. Bowel sounds are normal. He exhibits no distension. There is no tenderness.  Musculoskeletal: He exhibits no edema (No LEE).  Lymphadenopathy:     He has no cervical adenopathy.  Neurological: He is alert and oriented to person, place, and time. He exhibits normal muscle tone.  Skin: Skin is warm and dry.  Psychiatric: He has a normal mood and affect.     Assessment/Plan Principal Problem:   NSTEMI (non-ST elevated myocardial infarction) Active Problems:   HTN (hypertension)   Hypokalemia  Plan:  Second Troponin now 2.42.  Continue IV heparin and IV NTG.  Check lipids, TSH, A1C.  Replace K.  Left heart cath in AM.  2D echo.  Lopressor, liptor 3m.  HTarri Fuller3/10/2014, 2:31 PM  Patient seen with PA, agree with the above note.    53yo with history of HTN presents with NSTEMI.  He has no family history of premature CAD and does not smoke.  I do not know what his cholesterol is.  He exercises regularly.  He has had chest pain since last night, waxing and waning.  When it did not resolve today, he came  to the ER.  He still has 3/10 CP despite NTG gtt.  TnI is elevated.  He has inferior and anterolateral T wave inversions.  BP has been high.  - Start heparin gtt - Titrate NTG gtt for BP and chest pain.  - Will give IV metoprolol now with elevated BP.  - ASA, atorvastatin 80.  - Will plan LHC today.   [name] 01/28/2014 3:07 PM

## 2014-01-28 NOTE — ED Notes (Signed)
RN clarified Heparin with Dr. Rogene Houston sts ignore new order, and heparin is to go in at current rate.

## 2014-01-28 NOTE — ED Notes (Signed)
Pt c/o epigastric/L chest pain since 0130 while laying in bed.  Pt took "a few" aspirin and gingerale, started burping and felt relief.  Pt woke  Up, at a sandwich, played tennis, and ate another sandwich.  States pain increased after eating sandwich.  Pt non-diaphoretic, but feels nauseated and feels "like I need to burp".

## 2014-01-28 NOTE — ED Notes (Addendum)
Pt requesting food- dr. Rogene Houston made aware sts would prefer to wait for cardiology to see patient, but if patient is insistent he can eat. Pt made aware. RN gave pt. gingerale and saltines for now. Heart Healthy Meal tray ordered.

## 2014-01-28 NOTE — ED Notes (Signed)
RN re-informed pt of troponin results, admission, and troponin work-up. Pt in NAD, denies pain at present time. Pt has additional questions about plan and discharge RN made [facility] ([facility] MD aware.

## 2014-01-28 NOTE — ED Notes (Addendum)
Bryan Torres with cardiology at bedside

## 2014-01-28 NOTE — Progress Notes (Signed)
ANTICOAGULATION CONSULT NOTE - Initial Consult  Pharmacy Consult for Heparin Indication: chest pain/ACS  No Known Allergies  Patient Measurements: Height: 5' 8.5" (174 cm) Weight: 208 lb (94.348 kg) IBW/kg (Calculated) : 69.55 Heparin Dosing Weight:  89 kg  Vital Signs: Temp: 99.1 F (37.3 C) (03/12 1129) Temp src: Oral (03/12 1129) BP: 169/99 mmHg (03/12 1133) Pulse Rate: 67 (03/12 1129)  Labs: No results found for this basename: HGB, HCT, PLT, APTT, LABPROT, INR, HEPARINUNFRC, CREATININE, CKTOTAL, CKMB, TROPONINI,  in the last 72 hours  CrCl is unknown because no creatinine reading has been taken.   Medical History: Past Medical History  Diagnosis Date  . Hypertension     Medications:  HCTZ, Ibuprofen, Zantac  Assessment: Epigastric and L chest pain  53 y/o M with no significant PMH except HTN presents with CP that developed overnight. Troponin 1.68. Admit labs pending  Baseline labs: Scr 0.92, Hgb 15.4. Plts 248. Baseline INR 1.01.  Goal of Therapy:  Heparin level 0.3-0.7 units/ml Monitor platelets by anticoagulation protocol: Yes   Plan:  Heparin 4000 unit IV bolus Heparin infusion 1150 units/hr as ordered by ED MD at 12 units/kg/hr. Check heparin level in 6-8 hrs and daily.   Kristain Filo S. Alford Highland, PharmD, BCPS Clinical Staff Pharmacist Pager [phone]  Eilene Ghazi Stillinger 01/28/2014,12:46 PM

## 2014-01-28 NOTE — ED Notes (Signed)
Pharmacy notified to send Heparin

## 2014-01-28 NOTE — ED Notes (Addendum)
[facility]-Cardiologist at bedside.

## 2014-01-29 ENCOUNTER — Inpatient Hospital Stay (HOSPITAL_COMMUNITY): Payer: BC Managed Care – PPO

## 2014-01-29 LAB — BASIC METABOLIC PANEL
BUN: 9 mg/dL (ref 6–23)
CALCIUM: 8.2 mg/dL — AB (ref 8.4–10.5)
CO2: 24 mEq/L (ref 19–32)
Chloride: 101 mEq/L (ref 96–112)
Creatinine, Ser: 1.09 mg/dL (ref 0.50–1.35)
GFR calc non Af Amer: 76 mL/min — ABNORMAL LOW (ref >90)
GFR, EST AFRICAN AMERICAN: 88 mL/min — AB (ref >90)
GLUCOSE: 113 mg/dL — AB (ref 70–99)
Potassium: 3.3 mEq/L — ABNORMAL LOW (ref 3.7–5.3)
SODIUM: 140 meq/L (ref 137–147)

## 2014-01-29 LAB — LIPID PANEL
Cholesterol: 149 mg/dL (ref 0–200)
HDL: 42 mg/dL (ref >39)
LDL Cholesterol: 92 mg/dL (ref 0–99)
Total CHOL/HDL Ratio: 3.5 RATIO
Triglycerides: 74 mg/dL (ref <150)
VLDL: 15 mg/dL (ref 0–40)

## 2014-01-29 LAB — URINALYSIS, ROUTINE W REFLEX MICROSCOPIC
BILIRUBIN URINE: NEGATIVE
Glucose, UA: NEGATIVE mg/dL
Hgb urine dipstick: NEGATIVE
KETONES UR: NEGATIVE mg/dL
LEUKOCYTES UA: NEGATIVE
NITRITE: NEGATIVE
Protein, ur: NEGATIVE mg/dL
Specific Gravity, Urine: 1.01 (ref 1.005–1.030)
Urobilinogen, UA: 0.2 mg/dL (ref 0.0–1.0)
pH: 6.5 (ref 5.0–8.0)

## 2014-01-29 LAB — HEMOGLOBIN A1C
Hgb A1c MFr Bld: 6 % — ABNORMAL HIGH (ref <5.7)
MEAN PLASMA GLUCOSE: 126 mg/dL — AB (ref <117)

## 2014-01-29 LAB — CBC
HCT: 36.2 % — ABNORMAL LOW (ref 39.0–52.0)
HEMOGLOBIN: 12.3 g/dL — AB (ref 13.0–17.0)
MCH: 27.2 pg (ref 26.0–34.0)
MCHC: 34 g/dL (ref 30.0–36.0)
MCV: 80.1 fL (ref 78.0–100.0)
Platelets: 210 10*3/uL (ref 150–400)
RBC: 4.52 MIL/uL (ref 4.22–5.81)
RDW: 13 % (ref 11.5–15.5)
WBC: 5.8 10*3/uL (ref 4.0–10.5)

## 2014-01-29 LAB — TROPONIN I
Troponin I: >20 ng/mL (ref <0.30)
Troponin I: >20 ng/mL (ref <0.30)
Troponin I: >20 ng/mL (ref <0.30)

## 2014-01-29 LAB — TSH: TSH: 0.39 u[IU]/mL (ref 0.350–4.500)

## 2014-01-29 MED ORDER — POTASSIUM CHLORIDE CRYS ER 20 MEQ PO TBCR
40.0000 meq | EXTENDED_RELEASE_TABLET | Freq: Once | ORAL | Status: AC
  Administered 2014-01-29: 40 meq via ORAL
  Filled 2014-01-29: qty 2

## 2014-01-29 MED ORDER — ATROPINE SULFATE 0.1 MG/ML IJ SOLN
INTRAMUSCULAR | Status: AC
  Filled 2014-01-29: qty 10

## 2014-01-29 MED FILL — Sodium Chloride IV Soln 0.9%: INTRAVENOUS | Qty: 50 | Status: AC

## 2014-01-29 NOTE — Progress Notes (Signed)
Utilization review completed. Cinda Hara, RN, BSN. 

## 2014-01-29 NOTE — Progress Notes (Addendum)
SUBJECTIVE:  No further CP  OBJECTIVE:   Vitals:   Filed Vitals:   01/29/14 0302 01/29/14 0400 01/29/14 0500 01/29/14 0600  BP: 118/66 117/58 107/57 128/65  Pulse: 93 72 73 72  Temp:  100.2 F (37.9 C)    TempSrc:  Oral    Resp: 22 22 18 22   Height:      Weight:      SpO2: 100% 96% 97% 97%   I&O's:   Intake/Output Summary (Last 24 hours) at 01/29/14 0655 Last data filed at 01/29/14 0500  Gross per 24 hour  Intake 2733.93 ml  Output   1000 ml  Net 1733.93 ml   TELEMETRY: Reviewed telemetry pt in NSR:     PHYSICAL EXAM General: Well developed, well nourished, in no acute distress Head: Eyes PERRLA, No xanthomas.   Normal cephalic and atramatic  Lungs:   Clear bilaterally to auscultation and percussion. Heart:   HRRR S1 S2 Pulses are 2+ & equal. Abdomen: Bowel sounds are positive, abdomen soft and non-tender without masses Extremities:   No clubbing, cyanosis or edema.  DP +1 Neuro: Alert and oriented X 3. Psych:  Good affect, responds appropriately   LABS: Basic Metabolic Panel:  Recent Labs  01/28/14 1207 01/29/14 0410  NA 138 140  K 3.3* 3.3*  CL 98 101  CO2 26 24  GLUCOSE 116* 113*  BUN 10 9  CREATININE 0.92 1.09  CALCIUM 9.3 8.2*   Liver Function Tests:  Recent Labs  01/28/14 1207  AST 75*  ALT 38  ALKPHOS 86  BILITOT 1.3*  PROT 7.7  ALBUMIN 4.0   No results found for this basename: LIPASE, AMYLASE,  in the last 72 hours CBC:  Recent Labs  01/28/14 1207 01/29/14 0410  WBC 4.9 5.8  HGB 15.4 12.3*  HCT 44.1 36.2*  MCV 79.6 80.1  PLT 248 210   Cardiac Enzymes:  Recent Labs  01/28/14 2339 01/29/14 0410  TROPONINI >20.00* >20.00*   BNP: No components found with this basename: POCBNP,  D-Dimer: No results found for this basename: DDIMER,  in the last 72 hours Hemoglobin A1C: No results found for this basename: HGBA1C,  in the last 72 hours Fasting Lipid Panel:  Recent Labs  01/29/14 0410  CHOL 149  HDL 42  LDLCALC 92    TRIG 74  CHOLHDL 3.5   Thyroid Function Tests: No results found for this basename: TSH, T4TOTAL, FREET3, T3FREE, THYROIDAB,  in the last 72 hours Anemia Panel: No results found for this basename: VITAMINB12, FOLATE, FERRITIN, TIBC, IRON, RETICCTPCT,  in the last 72 hours Coag Panel:   Lab Results  Component Value Date   INR 1.01 01/28/2014    RADIOLOGY: Dg Chest 2 View  01/28/2014   CLINICAL DATA:  Epigastric chest pain  EXAM: CHEST  2 VIEW  COMPARISON:  None.  FINDINGS: The heart size and mediastinal contours are within normal limits. Both lungs are clear. The visualized skeletal structures are unremarkable.  IMPRESSION: No active cardiopulmonary disease.   Electronically Signed   By: Jorje Guild M.D.   On: 01/28/2014 12:03    Principal Problem:  NSTEMI (non-ST elevated myocardial infarction)  Active Problems:  HTN (hypertension)  Hypokalemia  ASCAD  Assessment/Plan  1.  NSTEMI due to occluded distal LCx - continue to cycle enzymes until they peak -ASA/Brilinta/statin/beta blocker - transfer to tele bed - d/c IV NTG gtt 2.  ASCAD s/p cath with 40% ostial small ramus, total occlusion of distal LCx  and patent RCA  s/p PCI of the distal left circ 3.  Normal LVF EF 60-65% with basal inferior severe HK Plan: Second Troponin now 2.42. Continue IV heparin and IV NTG. Check lipids, TSH, A1C. Replace K. Left heart cath in AM. 2D echo. Lopressor, liptor 80mg . 4.  Hypokalemia - he was on HCTZ at home which was not ordered on admit.  BP controlled on just beta blocker so will not reorder at this time.  Will replete with 37meq of Kdur and check BMET in am 5.  Fever ? Etiology - check PA and Lat chest xray - check UA - check 2 sets of blood cultures  Probable D/C Sat am     Sueanne Margarita, MD  01/29/2014  6:55 AM

## 2014-01-29 NOTE — Care Management Note (Unsigned)
    Page 1 of 1   01/29/2014     2:19:58 PM   CARE MANAGEMENT NOTE 01/29/2014  Patient:  ANANTH, FIALLOS   Account Number:  [id number]  Date Initiated:  01/29/2014  Documentation initiated by:  [facility]  Subjective/Objective Assessment:   Admitted with NSTEMI     Action/Plan:   Anticipated DC Date:  02/01/2014   Anticipated DC Plan:  [place]  CM consult      Choice offered to / List presented to:             Status of service:  In process, will continue to follow Medicare Important Message given?   (If response is "NO", the following Medicare IM given date fields will be blank) Date Medicare IM given:   Date Additional Medicare IM given:    Discharge Disposition:    Per UR Regulation:  Reviewed for med. necessity/level of care/duration of stay  If discussed at Long Length of Stay Meetings, dates discussed:    Comments:  ContactJoaquin Music Mother [phone]  01-29-14 [address], [place] [phone] Per benefits check-PT DOES NOT HAVE A COINSURANCE-PT DEDUCTIBLE IS $3,000 PT HAS NOT MET DEDUCTABLE. CM did provide pt with a 30 day free card. Pt will need Rx for 30 day free no refills and the original with refills. Pt will have to pay full price until deductible has been met.

## 2014-01-29 NOTE — Progress Notes (Signed)
CARDIAC REHAB PHASE I   PRE:  Rate/Rhythm: 72 SR  BP:  Supine: 124/92  Sitting:   Standing:    SaO2: 99 RA  MODE:  Ambulation: 550 ft   POST:  Rate/Rhythm: 64  BP:  Supine:   Sitting: 138/79  Standing:    SaO2: 100 RA 1130-1245 On arrival pt in bed , sleeping. Pt arouses easily. Pt tolerated ambulation well without c/o of cp or SOB. VS stable. Completed MI and stent education with pt. Pt was sleepy during education. His mother and wife were also there for education. Pt agrees to [facility]. CRP in [place], will send referral. I placed Heart Attack video on for pt and family to watch. Will follow pt tomorrow to see if he understands education provided.  Rodney Langton RN 01/29/2014 1:25 PM

## 2014-01-30 ENCOUNTER — Encounter (HOSPITAL_COMMUNITY): Payer: Self-pay | Admitting: Physician Assistant

## 2014-01-30 DIAGNOSIS — I2510 Atherosclerotic heart disease of native coronary artery without angina pectoris: Secondary | ICD-10-CM

## 2014-01-30 DIAGNOSIS — E876 Hypokalemia: Secondary | ICD-10-CM

## 2014-01-30 DIAGNOSIS — E785 Hyperlipidemia, unspecified: Secondary | ICD-10-CM

## 2014-01-30 DIAGNOSIS — R509 Fever, unspecified: Secondary | ICD-10-CM

## 2014-01-30 LAB — BASIC METABOLIC PANEL
BUN: 11 mg/dL (ref 6–23)
CO2: 26 mEq/L (ref 19–32)
CREATININE: 1.16 mg/dL (ref 0.50–1.35)
Calcium: 8.6 mg/dL (ref 8.4–10.5)
Chloride: 103 mEq/L (ref 96–112)
GFR, EST AFRICAN AMERICAN: 82 mL/min — AB (ref >90)
GFR, EST NON AFRICAN AMERICAN: 71 mL/min — AB (ref >90)
Glucose, Bld: 94 mg/dL (ref 70–99)
Potassium: 3.4 mEq/L — ABNORMAL LOW (ref 3.7–5.3)
Sodium: 141 mEq/L (ref 137–147)

## 2014-01-30 MED ORDER — NITROGLYCERIN 0.4 MG SL SUBL: 0.4000 mg | SUBLINGUAL_TABLET | SUBLINGUAL | Status: DC | PRN

## 2014-01-30 MED ORDER — POTASSIUM CHLORIDE CRYS ER 20 MEQ PO TBCR
20.0000 meq | EXTENDED_RELEASE_TABLET | Freq: Every day | ORAL | Status: DC
Start: 2014-01-30 — End: 2014-01-30
  Administered 2014-01-30: 20 meq via ORAL
  Filled 2014-01-30: qty 1

## 2014-01-30 MED ORDER — TICAGRELOR 90 MG PO TABS: 90.0000 mg | ORAL_TABLET | Freq: Two times a day (BID) | ORAL | Status: DC

## 2014-01-30 MED ORDER — ASPIRIN 81 MG PO TBEC: 81.0000 mg | DELAYED_RELEASE_TABLET | Freq: Every day | ORAL | Status: DC

## 2014-01-30 MED ORDER — POTASSIUM CHLORIDE CRYS ER 20 MEQ PO TBCR: 20.0000 meq | EXTENDED_RELEASE_TABLET | Freq: Every day | ORAL | Status: DC

## 2014-01-30 MED ORDER — ATORVASTATIN CALCIUM 80 MG PO TABS: 80.0000 mg | ORAL_TABLET | Freq: Every day | ORAL | Status: DC

## 2014-01-30 MED ORDER — HYDROCHLOROTHIAZIDE 12.5 MG PO CAPS
12.5000 mg | ORAL_CAPSULE | Freq: Every day | ORAL | Status: DC
  Administered 2014-01-30: 12.5 mg via ORAL
  Filled 2014-01-30: qty 1

## 2014-01-30 MED ORDER — METOPROLOL TARTRATE 25 MG PO TABS: 25.0000 mg | ORAL_TABLET | Freq: Two times a day (BID) | ORAL | Status: DC

## 2014-01-30 NOTE — Progress Notes (Signed)
[id number] Cardiac Rehab Reviewed MI and stent education with pt. He was very sleepy yesterday. He voices understanding. Encouraged pt to work on things that cause him stress. Put MI and Stent videos on for pt and wife to view. Deon Pilling, RN 01/30/2014 10:35 AM

## 2014-01-30 NOTE — Discharge Summary (Signed)
Discharge Summary   Patient ID: Bryan Torres,  MRN: [id number], DOB/AGE: 53/15/1962 53 y.o.  Admit date: 01/28/2014 Discharge date: 01/30/2014  Primary Physician: No primary provider on file. Primary Cardiologist: Henrene Dodge, MD  Discharge Diagnoses Principal Problem:   NSTEMI (non-ST elevated myocardial infarction)  - S/p DES-100% distal LCx on 01/28/14; nonobstructive dz elsewhere, EF 60-65, basal inferior severe HK  - DAPT- ASA/Brilinta x 12 months Active Problems:   CAD (coronary artery disease), native coronary artery  - As above  - Discharged on ASA, Brilinta, Lopressor, Lipitor, NTG SL PRN   HTN (hypertension)  - Resumed on HCTZ + KCl   Hyperlipidemia  - Discharged on high-potency atorvastatin 80 (ASCVD)  - LDL 92, HDL 42, TG 74, TC 149   Fever  - On AM of 3/13  - U/a w/o evidence of UTI  - Repeat CXR w/o evidence of PNA/bronchitis  - Blood cultures x 2 NGTD  - Resolved around noon, never recurred. Question in the setting of MI. 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   Hypokalemia  - Mild on admission  - Discharged w/ potassium supplementation  Allergies No Known Allergies  DIAGNOSTIC STUDIES/PROCEDURES  PA/LATERAL CHEST X-RAY - 01/28/14  IMPRESSION:  No active cardiopulmonary disease.  CARDIAC CATHETERIZATION - 01/28/14  Hemodynamics:  AO 123/84  LV 131/22  Coronary angiography:  Coronary dominance: codominant  Left mainstem: No significant disease.  Left anterior descending (LAD): Mild luminal irregularities.  Left circumflex (LCx): Codominant vessel. Small ramus with 40% ostial narrowing. There was total occlusion of the distal LCx after a moderate PLOM.  Right coronary artery (RCA): Codominant vessel. No significant disease.  Left ventriculography: Left ventricular systolic function is normal, LVEF is estimated at 60-65%, there was basal inferior severe hypokinesis.   PERCUTANEOUS CORONARY INTERVENTION -  01/28/14  IMPRESSION:  Successful percutaneous coronary intervention the distal left circumflex coronary artery with the 100% occlusion of the AV groove after the second marginal vessel being reduced to 0% reestablishment of flow to 3 additional branches of the distal circumflex which were beyond this occluded segment.  PA/LATERAL CHEST X-RAY - 01/29/14  IMPRESSION:  1. Cannot exclude small right pleural effusion.  2. Exam otherwise unremarkable. No focal infiltrate noted.  History of Present Illness  Bryan Torres is a 53 y.o. male who was admitted at [facility] from 3/12 to 3/14 with the above problem list.   He has no prior cardiac history. Endorsed a h/o HTN. He reported experiencing chest discomfort the evening prior to admission which he attributed to indigestion. He had associated belching and nausea. He took two ASA 81mg  and tums. This resolved, but then recurred later that evening at a 8/10 intensity and L arm tingling. He took advil with some relief, however the pain recurred once more and he presented to [facility] ED for further evaluation.  Hospital Course   There, EKG did reveal downsloping ST depressions and TW inversions in II, III, aVF and V5, V6. An initial TnI returned elevated at 1.68, then 2.42. CXR as above indicated no acute process. He was heparinized, started on NTG gtt for BP control and anginal relief. He was started on Lopressor, low-dose daily ASA and atorvastatin 80. He was admitted and underwent cardiac catheterization later that day.   For full details of the procedure, please review the cath lab note above. Notably, a culprit 100% distal LCx lesion was identified on diagnostic angiography. A drug-eluting stent was successfully placed to this  lesion restoring flow as a staged intervention. EF 60-65%, severe basal HK noted. He tolerated the procedure well and without complications.   He was observed overnight. Troponin trended to > 20.00. TSH returned WNL. Lipid panel  as above. He had no further chest discomfort. There were no acute events. He was noted to be febrile the following morning (100.2). U/a, CXR and blood cultures indicated no evidence of infection. Subsequent temps recorded yesterday and today have been WNL.   He was evaluated by Dr. Mare Ferrari this AM and deemed to be stable for discharge. He will be discharged on the medication regimen outlined below. HCTZ will be resumed in addition to potassium supplementation given evidence of mild hypokalemia this admission. He will follow-up in the clinic within 7 days as part of transitional care management. This information, including post-cath instructions and activity restrictions, has been clearly outlined in the discharge AVS.   Discharge Vitals:  Blood pressure 146/92, pulse 88, temperature 98.8 F (37.1 C), temperature source Oral, resp. rate 18, height 5' 8.5" (1.74 m), weight 92.6 kg (204 lb 2.3 oz), SpO2 99.00%.   Labs: Recent Labs     01/28/14  1207  01/29/14  0410  WBC  4.9  5.8  HGB  15.4  12.3*  HCT  44.1  36.2*  MCV  79.6  80.1  PLT  248  210   Recent Labs Lab 01/28/14 1207 01/29/14 0410 01/30/14 0634  NA 138 140 141  K 3.3* 3.3* 3.4*  CL 98 101 103  CO2 26 24 26   BUN 10 9 11   CREATININE 0.92 1.09 1.16  CALCIUM 9.3 8.2* 8.6  PROT 7.7  --   --   BILITOT 1.3*  --   --   ALKPHOS 86  --   --   ALT 38  --   --   AST 75*  --   --   GLUCOSE 116* 113* 94   Recent Labs     01/28/14  2339  HGBA1C  6.0*   Recent Labs     01/28/14  2339  01/29/14  0410  01/29/14  0910  TROPONINI  >20.00*  >20.00*  >20.00*   Recent Labs     01/29/14  0410  CHOL  149  HDL  42  LDLCALC  92  TRIG  74  CHOLHDL  3.5    Recent Labs  01/28/14 2339  TSH 0.390    Disposition:  Discharge Orders   Future Orders Complete By Expires   Amb Referral to Cardiac Rehabilitation  As directed    Diet - low sodium heart healthy  As directed    Increase activity slowly  As directed           Follow-up Information   Follow up with [facility]. (Office will call you with an appointment date and time to be scheduled within 1 week of discharge.)    Contact information:   [place] [address] [phone]      Follow up In 1 week. (Please establish with a primary care provider and follow-up in 1 week after hospital discharge.)       Discharge Medications:    Medication List    STOP taking these medications       ibuprofen 200 MG tablet  Commonly known as:  ADVIL,MOTRIN      TAKE these medications       ACID REDUCER PO  Take 1-2 tablets by  mouth daily as needed (stomach acid).     aspirin 81 MG EC tablet  Take 1 tablet (81 mg total) by mouth daily.     atorvastatin 80 MG tablet  Commonly known as:  LIPITOR  Take 1 tablet (80 mg total) by mouth daily at 6 PM.     hydrochlorothiazide 25 MG tablet  Commonly known as:  HYDRODIURIL  Take 12.5 mg by mouth daily.     metoprolol tartrate 25 MG tablet  Commonly known as:  LOPRESSOR  Take 1 tablet (25 mg total) by mouth 2 (two) times daily.     nitroGLYCERIN 0.4 MG SL tablet  Commonly known as:  NITROSTAT  Place 1 tablet (0.4 mg total) under the tongue every 5 (five) minutes x 3 doses as needed for chest pain.     potassium chloride SA 20 MEQ tablet  Commonly known as:  K-DUR,KLOR-CON  Take 1 tablet (20 mEq total) by mouth daily.     Ticagrelor 90 MG Tabs tablet  Commonly known as:  BRILINTA  Take 1 tablet (90 mg total) by mouth 2 (two) times daily.       Outstanding Labs/Studies: BMET on follow-up  Duration of Discharge Encounter: Greater than 30 minutes including physician time.  Signed, R. Valeria Batman, PA-C 01/30/2014, 10:12 AM

## 2014-01-30 NOTE — Discharge Instructions (Signed)
PLEASE TAKE ALL NEW MEDICATIONS/MEDICATION CHANGES AS PRESCRIBED.   PLEASE ATTEND ALL SCHEDULED/RECOMMENDED FOLLOW-UP APPOINTMENTS.  Activity: Increase activity slowly as tolerated. You may shower, but no soaking baths for 1 week. No driving until after follow-up. No lifting over 5 lbs for 2 weeks. No sexual activity for 2 weeks.  You May Return to Work: after follow-up appointment  Wound Care: You may wash cath site gently with soap and water. Keep cath site clean and dry. If you notice pain, swelling, bleeding or pus at your cath site, please call [phone].  Radial Site Care  Refer to this sheet in the next few weeks. These instructions provide you with information on caring for yourself after your procedure. Your caregiver may also give you more specific instructions. Your treatment has been planned according to current medical practices, but problems sometimes occur. Call your caregiver if you have any problems or questions after your procedure.  HOME CARE INSTRUCTIONS  You may shower the day after the procedure. Remove the bandage (dressing) and gently wash the site with plain soap and water. Gently pat the site dry.  Do not apply powder or lotion to the site.  Do not submerge the affected site in water for 3 to 5 days.  Inspect the site at least twice daily.  Do not flex or bend the affected arm for 24 hours.  No lifting over 5 pounds (2.3 kg) for 5 days after your procedure.  Do not drive home if you are discharged the same day of the procedure. Have someone else drive you.  You may drive 24 hours after the procedure unless otherwise instructed by your caregiver.  Do not operate machinery or power tools for 24 hours.  A responsible adult should be with you for the first 24 hours after you arrive home. What to expect:  Any bruising will usually fade within 1 to 2 weeks.  Blood that collects in the tissue (hematoma) may be painful to the touch. It should usually decrease in size and  tenderness within 1 to 2 weeks. SEEK IMMEDIATE MEDICAL CARE IF:  You have unusual pain at the radial site.  You have redness, warmth, swelling, or pain at the radial site.  You have drainage (other than a small amount of blood on the dressing).  You have chills.  You have a fever or persistent symptoms for more than 72 hours.  You have a fever and your symptoms suddenly get worse.  Your arm becomes pale, cool, tingly, or numb.  You have heavy bleeding from the site. Hold pressure on the site. Document Released: 12/08/2010 Document Revised: 01/28/2012 Document Reviewed: 12/08/2010  [facility] Patient Information 2014 [place], [place].  Atorvastatin tablets What is this medicine? ATORVASTATIN (a TORE va sta tin) is known as a HMG-CoA reductase inhibitor or 'statin'. It lowers the level of cholesterol and triglycerides in the blood. This drug may also reduce the risk of heart attack, stroke, or other health problems in patients with risk factors for heart disease. Diet and lifestyle changes are often used with this drug. This medicine may be used for other purposes; ask your health care provider or pharmacist if you have questions. COMMON BRAND NAME(S): Lipitor What should I tell my health care provider before I take this medicine? They need to know if you have any of these conditions: -frequently drink alcoholic beverages -history of stroke, TIA -kidney disease -liver disease -muscle aches or weakness -other medical condition -an unusual or allergic reaction to atorvastatin, other medicines,  foods, dyes, or preservatives -pregnant or trying to get pregnant -breast-feeding How should I use this medicine? Take this medicine by mouth with a glass of water. Follow the directions on the prescription label. You can take this medicine with or without food. Take your doses at regular intervals. Do not take your medicine more often than directed. Talk to your pediatrician regarding the use of this  medicine in children. While this drug may be prescribed for children as young as 53 years old for selected conditions, precautions do apply. Overdosage: If you think you have taken too much of this medicine contact a poison control center or emergency room at once. NOTE: This medicine is only for you. Do not share this medicine with others. What if I miss a dose? If you miss a dose, take it as soon as you can. If it is almost time for your next dose, take only that dose. Do not take double or extra doses. What may interact with this medicine? Do not take this medicine with any of the following medications: -red yeast rice -telaprevir -telithromycin -voriconazole This medicine may also interact with the following medications: -alcohol -antiviral medicines for HIV or AIDS -boceprevir -certain antibiotics like clarithromycin, erythromycin, troleandomycin -certain medicines for cholesterol like fenofibrate or gemfibrozil -cimetidine -clarithromycin -colchicine -cyclosporine -digoxin -male hormones, like estrogens or progestins and birth control pills -grapefruit juice -medicines for fungal infections like fluconazole, itraconazole, ketoconazole -niacin -rifampin -spironolactone This list may not describe all possible interactions. Give your health care provider a list of all the medicines, herbs, non-prescription drugs, or dietary supplements you use. Also tell them if you smoke, drink alcohol, or use illegal drugs. Some items may interact with your medicine. What should I watch for while using this medicine? Visit your doctor or health care professional for regular check-ups. You may need regular tests to make sure your liver is working properly. Tell your doctor or health care professional right away if you get any unexplained muscle pain, tenderness, or weakness, especially if you also have a fever and tiredness. Your doctor or health care professional may tell you to stop taking this  medicine if you develop muscle problems. If your muscle problems do not go away after stopping this medicine, contact your health care professional. This drug is only part of a total heart-health program. Your doctor or a dietician can suggest a low-cholesterol and low-fat diet to help. Avoid alcohol and smoking, and keep a proper exercise schedule. Do not use this drug if you are pregnant or breast-feeding. Serious side effects to an unborn child or to an infant are possible. Talk to your doctor or pharmacist for more information. This medicine may affect blood sugar levels. If you have diabetes, check with your doctor or health care professional before you change your diet or the dose of your diabetic medicine. If you are going to have surgery tell your health care professional that you are taking this drug. What side effects may I notice from receiving this medicine? Side effects that you should report to your doctor or health care professional as soon as possible: -allergic reactions like skin rash, itching or hives, swelling of the face, lips, or tongue -dark urine -fever -joint pain -muscle cramps, pain -redness, blistering, peeling or loosening of the skin, including inside the mouth -trouble passing urine or change in the amount of urine -unusually weak or tired -yellowing of eyes or skin Side effects that usually do not require medical attention (report to your doctor  or health care professional if they continue or are bothersome): -constipation -heartburn -stomach gas, pain, upset This list may not describe all possible side effects. Call your doctor for medical advice about side effects. You may report side effects to FDA at 1-800-FDA-1088. Where should I keep my medicine? Keep out of the reach of children. Store at room temperature between 20 to 25 degrees C (68 to 77 degrees F). Throw away any unused medicine after the expiration date. NOTE: This sheet is a summary. It may not  cover all possible information. If you have questions about this medicine, talk to your doctor, pharmacist, or health care provider.  2014, Elsevier/Gold Standard. (2011-09-25 09:18:24) Potassium (K) Potassium is an electrolyte that helps regulate the amount of fluid in the body. It also stimulates muscle contraction and maintains a stable acid-base balance. Most of the body's potassium is inside of cells, and only a very small amount is in the blood. Because the amount in the blood is so small, minor changes can have big effects. PREPARATION FOR TEST Testing for potassium requires taking a blood sample taken by needle from a vein in the arm. The skin is cleaned thoroughly before the sample is drawn. There is no other special preparation needed. NORMAL FINDINGS  Adults: 3.5-5 mEq/L (3.5-5 mmol/L).  Premature neonates, cord blood: 5-10.2 mEq/L (5-10.2 mmol/L).  Premature neonates, 48 hours: 3-6 mEq/L (3-6 mmol/L).  Neonates: 3.7-5.9 mEq/L (3.7-5.9 mmol/L).  Neonates, cord blood: 5.6-12 mEq/L (5.6-12 mmol/L).  Infants: 4.1-5.3 mEq/L (4.1-5.3 mmol/L).  Children: 3.4-4.7 mEq/L (3.4-4.7 mmol/L). Ranges for normal findings may vary among different laboratories and hospitals. You should always check with your doctor after having lab work or other tests done to discuss the meaning of your test results and whether your values are considered within normal limits. MEANING OF TEST Your caregiver will go over the test results with you and discuss the importance and meaning of your results, as well as treatment options and the need for additional tests if necessary. OBTAINING THE TEST RESULTS It is your responsibility to obtain your test results. Ask the lab or department performing the test when and how you will get your results. Document Released: 12/08/2004 Document Revised: 01/28/2012 Document Reviewed: 10/17/2008 [facility] Patient Information 2014 [place], [place]. Ticagrelor oral tablet What is  this medicine? TICAGRELOR (TYE ka GREL or) helps to prevent blood clots. This medicine is used to prevent heart attack, stroke, or other vascular events in people who have had a recent heart attack or who have severe chest pain. This medicine may be used for other purposes; ask your health care provider or pharmacist if you have questions. COMMON BRAND NAME(S): BRILINTA What should I tell my health care provider before I take this medicine? They need to know if you have any of these conditions: -bleeding disorder -bleeding in the brain -liver disease -planned surgery -stomach or intestinal ulcers -stroke or transient ischemic attack -an unusual or allergic reaction to ticagrelor, other medicines, foods, dyes, or preservatives -pregnant or trying to get pregnant -breast-feeding How should I use this medicine? Take this medicine by mouth with a glass of water. Follow the directions on the prescription label. You can take it with or without food. If it upsets your stomach, take it with food. Take your medicine at regular intervals. Do not take it more often than directed. Do not stop taking except on your doctor's advice. Talk to you pediatrician regarding the use of this medicine in children. Special care may be needed.  Overdosage: If you think you've taken too much of this medicine contact a poison control center or emergency room at once. Overdosage: If you think you have taken too much of this medicine contact a poison control center or emergency room at once. NOTE: This medicine is only for you. Do not share this medicine with others. What if I miss a dose? If you miss a dose, take it as soon as you can. If it is almost time for your next dose, take only that dose. Do not take double or extra doses. What may interact with this medicine? -certain antibiotics like clarithromycin and telithromycin -certain medicines for fungal infections like itraconazole, ketoconazole, and  voriconazole -certain medicines for HIV infection like atazanavir, indinavir, nelfinavir, ritonavir, and saquinavir -certain medicines for seizures like carbamazepine, phenobarbital, and phenytoin -certain medicines that treat or prevent blood clots like warfarin -dexamethasone -digoxin -lovastatin -nefazodone -rifampin -simvastatin This list may not describe all possible interactions. Give your health care provider a list of all the medicines, herbs, non-prescription drugs, or dietary supplements you use. Also tell them if you smoke, drink alcohol, or use illegal drugs. Some items may interact with your medicine. What should I watch for while using this medicine? Visit your doctor or health care professional for regular check ups. Do not stop taking you medicine unless your doctor tells you to. Notify your doctor or health care professional and seek emergency treatment if you develop breathing problems; changes in vision; chest pain; severe, sudden headache; pain, swelling, warmth in the leg; trouble speaking; sudden numbness or weakness of the face, arm, or leg. These can be signs that your condition has gotten worse. If you are going to have surgery or dental work, tell your doctor or health care professional that you are taking this medicine. You should take aspirin every day with this medicine. Do not take more than 100 mg each day. Talk to your doctor if you have questions. What side effects may I notice from receiving this medicine? Side effects that you should report to your doctor or health care professional as soon as possible: -allergic reactions like skin rash, itching or hives, swelling of the face, lips, or tongue -breathing problems -fast or irregular heartbeat -feeling faint or light-headed, falls -signs and symptoms of bleeding such as bloody or black, tarry stools; red or dark-brown urine; spitting up blood or brown material that looks like coffee grounds; red spots on the  skin; unusual bruising or bleeding from the eye, gums, or nose  Side effects that usually do not require medical attention (Report these to your doctor or health care professional if they continue or are bothersome.): -breast enlargement in both males and females -diarrhea -dizziness -headache -tiredness -upset stomach This list may not describe all possible side effects. Call your doctor for medical advice about side effects. You may report side effects to FDA at 1-800-FDA-1088. Where should I keep my medicine? Keep out of the reach of children. Store at room temperature of 59 to 86 degrees F (15 to 30 degrees C). Throw away any unused medicine after the expiration date. NOTE: This sheet is a summary. It may not cover all possible information. If you have questions about this medicine, talk to your doctor, pharmacist, or health care provider.  2014, Elsevier/Gold Standard. (2013-03-03 16:40:37) Metoprolol tablets What is this medicine? METOPROLOL (me TOE proe lole) is a beta-blocker. Beta-blockers reduce the workload on the heart and help it to beat more regularly. This medicine is used to  treat high blood pressure and to prevent chest pain. It is also used to after a heart attack and to prevent an additional heart attack from occurring. This medicine may be used for other purposes; ask your health care provider or pharmacist if you have questions. COMMON BRAND NAME(S): Lopressor What should I tell my health care provider before I take this medicine? They need to know if you have any of these conditions: -diabetes -heart or vessel disease like slow heart rate, worsening heart failure, heart block, sick sinus syndrome or Raynaud's disease -kidney disease -liver disease -lung or breathing disease, like asthma or emphysema -pheochromocytoma -thyroid disease -an unusual or allergic reaction to metoprolol, other beta-blockers, medicines, foods, dyes, or preservatives -pregnant or trying to  get pregnant -breast-feeding How should I use this medicine? Take this medicine by mouth with a drink of water. Follow the directions on the prescription label. Take this medicine immediately after meals. Take your doses at regular intervals. Do not take more medicine than directed. Do not stop taking this medicine suddenly. This could lead to serious heart-related effects. Talk to your pediatrician regarding the use of this medicine in children. Special care may be needed. Overdosage: If you think you have taken too much of this medicine contact a poison control center or emergency room at once. NOTE: This medicine is only for you. Do not share this medicine with others. What if I miss a dose? If you miss a dose, take it as soon as you can. If it is almost time for your next dose, take only that dose. Do not take double or extra doses. What may interact with this medicine? This medicine may interact with the following medications: -certain medicines for blood pressure, heart disease, irregular heart beat -certain medicines for depression like monoamine oxidase (MAO) inhibitors, fluoxetine, or paroxetine -clonidine -dobutamine -epinephrine -isoproterenol -reserpine This list may not describe all possible interactions. Give your health care provider a list of all the medicines, herbs, non-prescription drugs, or dietary supplements you use. Also tell them if you smoke, drink alcohol, or use illegal drugs. Some items may interact with your medicine. What should I watch for while using this medicine? Visit your doctor or health care professional for regular check ups. Contact your doctor right away if your symptoms worsen. Check your blood pressure and pulse rate regularly. Ask your health care professional what your blood pressure and pulse rate should be, and when you should contact them. You may get drowsy or dizzy. Do not drive, use machinery, or do anything that needs mental alertness until you  know how this medicine affects you. Do not sit or stand up quickly, especially if you are an older patient. This reduces the risk of dizzy or fainting spells. Contact your doctor if these symptoms continue. Alcohol may interfere with the effect of this medicine. Avoid alcoholic drinks. What side effects may I notice from receiving this medicine? Side effects that you should report to your doctor or health care professional as soon as possible: -allergic reactions like skin rash, itching or hives -cold or numb hands or feet -depression -difficulty breathing -faint -fever with sore throat -irregular heartbeat, chest pain -rapid weight gain -swollen legs or ankles Side effects that usually do not require medical attention (report to your doctor or health care professional if they continue or are bothersome): -anxiety or nervousness -change in sex drive or performance -dry skin -headache -nightmares or trouble sleeping -short term memory loss -stomach upset or diarrhea -unusually tired  This list may not describe all possible side effects. Call your doctor for medical advice about side effects. You may report side effects to FDA at 1-800-FDA-1088. Where should I keep my medicine? Keep out of the reach of children. Store at room temperature between 15 and 30 degrees C (59 and 86 degrees F). Throw away any unused medicine after the expiration date. NOTE: This sheet is a summary. It may not cover all possible information. If you have questions about this medicine, talk to your doctor, pharmacist, or health care provider.  2014, Elsevier/Gold Standard. (2013-07-10 14:40:36)

## 2014-01-30 NOTE — Progress Notes (Signed)
Patient Name: Bryan Torres Date of Encounter: 01/30/2014     Principal Problem:   NSTEMI (non-ST elevated myocardial infarction) Active Problems:   HTN (hypertension)    SUBJECTIVE  The patient feels well.  No chest pain.  He is in stable normal sinus rhythm.  Temperature is 99.  This may be from his myocardial infarction itself.  His urinalysis is negative.  His white count is negative.  His chest x-ray is negative.  CURRENT MEDS . aspirin EC  81 mg Oral Daily  . atorvastatin  80 mg Oral q1800  . metoprolol tartrate  25 mg Oral BID  . Ticagrelor  90 mg Oral BID    OBJECTIVE  Filed Vitals:   01/29/14 1217 01/29/14 1647 01/29/14 2117 01/30/14 0500  BP: 138/79 147/100 142/87 145/87  Pulse: 72 78 75 72  Temp: 99.6 F (37.6 C) 99.1 F (37.3 C) 99.5 F (37.5 C) 99.3 F (37.4 C)  TempSrc: Oral Oral Oral Oral  Resp:  16 18 18   Height:      Weight:      SpO2: 99% 100% 99% 98%    Intake/Output Summary (Last 24 hours) at 01/30/14 0744 Last data filed at 01/29/14 1100  Gross per 24 hour  Intake    240 ml  Output    600 ml  Net   -360 ml   Filed Weights   01/28/14 1133 01/28/14 2052  Weight: 208 lb (94.348 kg) 204 lb 2.3 oz (92.6 kg)    PHYSICAL EXAM  General: Pleasant, NAD. Neuro: Alert and oriented X 3. Moves all extremities spontaneously. Psych: Normal affect. HEENT:  Normal  Neck: Supple without bruits or JVD. Lungs:  Resp regular and unlabored, CTA. Heart: RRR no s3, s4, or murmurs. Abdomen: Soft, non-tender, non-distended, BS + x 4.  Right groin shows no hematoma  Extremities: No clubbing, cyanosis or edema. DP/PT/Radials 2+ and equal bilaterally.  Accessory Clinical Findings  CBC  Recent Labs  01/28/14 1207 01/29/14 0410  WBC 4.9 5.8  HGB 15.4 12.3*  HCT 44.1 36.2*  MCV 79.6 80.1  PLT 248 338   Basic Metabolic Panel  Recent Labs  01/29/14 0410 01/30/14 0634  NA 140 141  K 3.3* 3.4*  CL 101 103  CO2 24 26  GLUCOSE 113* 94  BUN  9 11  CREATININE 1.09 1.16  CALCIUM 8.2* 8.6   Liver Function Tests  Recent Labs  01/28/14 1207  AST 75*  ALT 38  ALKPHOS 86  BILITOT 1.3*  PROT 7.7  ALBUMIN 4.0   No results found for this basename: LIPASE, AMYLASE,  in the last 72 hours Cardiac Enzymes  Recent Labs  01/28/14 2339 01/29/14 0410 01/29/14 0910  TROPONINI >20.00* >20.00* >20.00*   BNP No components found with this basename: POCBNP,  D-Dimer No results found for this basename: DDIMER,  in the last 72 hours Hemoglobin A1C  Recent Labs  01/28/14 2339  HGBA1C 6.0*   Fasting Lipid Panel  Recent Labs  01/29/14 0410  CHOL 149  HDL 42  LDLCALC 92  TRIG 74  CHOLHDL 3.5   Thyroid Function Tests  Recent Labs  01/28/14 2339  TSH 0.390    TELE  Normal sinus rhythm  ECG  Sinus rhythm with Premature atrial complexes Nonspecific T wave abnormality Abnormal ECG  Radiology/Studies  Dg Chest 2 View  01/29/2014   CLINICAL DATA:  Fever and weakness.  EXAM: CHEST  2 VIEW  COMPARISON:  DG CHEST 2 VIEW  dated 01/28/2014  FINDINGS: Mediastinum and hilar structures normal. Lungs are clear. Small right pleural effusion cannot be excluded. No pneumothorax. Heart size normal. Degenerative changes thoracic spine.  IMPRESSION: 1. Cannot exclude small right pleural effusion. 2. Exam otherwise unremarkable.  No focal infiltrate noted.   Electronically Signed   By: Marcello Moores  Register   On: 01/29/2014 12:41   Dg Chest 2 View  01/28/2014   CLINICAL DATA:  Epigastric chest pain  EXAM: CHEST  2 VIEW  COMPARISON:  None.  FINDINGS: The heart size and mediastinal contours are within normal limits. Both lungs are clear. The visualized skeletal structures are unremarkable.  IMPRESSION: No active cardiopulmonary disease.   Electronically Signed   By: Jorje Guild M.D.   On: 01/28/2014 12:03    ASSESSMENT AND PLAN  1. NSTEMI due to occluded distal LCx .  Status post PCI of the distal left circumflex with drug-eluting  stent. Continue aspirin, Brilinta,beta blocker, statin 2. essential hypertension.  Blood pressure is still running a little high.  He states he was allergic to a blood pressure medicine in the past but does not know what it is.  It may have been an ACE inhibitor.  The patient was on HCTZ prior to admission.  We will restart HCTZ and also give him supplemental potassium.   Plan: Okay for discharge today.  He should be seen in about a week in the office for follow up with Dr. Aundra Dubin or PA/NP.  Repeat EKG and basal metabolic panel at that time.  No return to work until after being seen in the office.  The patient also anticipated as participating in the outpatient cardiac rehabilitation program.  Signed, Darlin Coco MD

## 2014-02-01 ENCOUNTER — Telehealth: Payer: Self-pay | Admitting: Interventional Cardiology

## 2014-02-01 NOTE — Telephone Encounter (Signed)
New message         Pt medication is causing diarrhea. Not sure which medication it is but would like to know what he should take for it. Not quite sure who the doctor is but was in the hospital by dr Tamala Julian and dr Aundra Dubin.

## 2014-02-01 NOTE — Telephone Encounter (Signed)
SPOKE  WITH  PT  IS COMPLAINING  WITH   CONSTIPATION   NOT  DIARRHEA  ENCOURAGED PT  MAY TRY   METAMUCIL  OR STOOL SOFTNER ,ETC   OR  ASK PHARMACISTS  WHAT  HE  WOULD  RECOMMEND . PT STATED THAT  EPSON  SALTS  WORKS REAL GOOD   INSTRUCTED  PT  NOT  TO  TAKE   THAT   AS  MAY  INCREASE  B/P .Adonis Housekeeper

## 2014-02-02 ENCOUNTER — Encounter: Payer: Self-pay | Admitting: *Deleted

## 2014-02-02 ENCOUNTER — Telehealth: Payer: Self-pay | Admitting: Physician Assistant

## 2014-02-02 NOTE — Telephone Encounter (Signed)
Patient's wife called.  He has been having diarrhea since 2pm.  I recommended he stay hydrated with Gatorade or other e- drink.  Hold HCTZ while this continues.  Try imodium.  Follow up with PCP tomorrow.  Tarri Fuller PAC

## 2014-02-03 ENCOUNTER — Encounter: Payer: Self-pay | Admitting: Cardiology

## 2014-02-03 ENCOUNTER — Ambulatory Visit (INDEPENDENT_AMBULATORY_CARE_PROVIDER_SITE_OTHER): Payer: BC Managed Care – PPO | Admitting: Cardiology

## 2014-02-03 ENCOUNTER — Encounter: Payer: Self-pay | Admitting: *Deleted

## 2014-02-03 VITALS — BP 132/78 | HR 71 | Ht 68.5 in | Wt 202.0 lb

## 2014-02-03 DIAGNOSIS — I2510 Atherosclerotic heart disease of native coronary artery without angina pectoris: Secondary | ICD-10-CM

## 2014-02-03 DIAGNOSIS — I214 Non-ST elevation (NSTEMI) myocardial infarction: Secondary | ICD-10-CM

## 2014-02-03 DIAGNOSIS — I10 Essential (primary) hypertension: Secondary | ICD-10-CM

## 2014-02-03 DIAGNOSIS — E785 Hyperlipidemia, unspecified: Secondary | ICD-10-CM

## 2014-02-03 LAB — BASIC METABOLIC PANEL
BUN: 18 mg/dL (ref 6–23)
CALCIUM: 8.5 mg/dL (ref 8.4–10.5)
CO2: 29 mEq/L (ref 19–32)
Chloride: 100 mEq/L (ref 96–112)
Creatinine, Ser: 1.4 mg/dL (ref 0.4–1.5)
GFR: 68.35 mL/min (ref >60.00)
Glucose, Bld: 93 mg/dL (ref 70–99)
Potassium: 3.6 mEq/L (ref 3.5–5.1)
Sodium: 136 mEq/L (ref 135–145)

## 2014-02-03 NOTE — Patient Instructions (Addendum)
You have been referred to Cardiac Rehab at [facility].  Your physician recommends that you have lab work today--BMET.    Your physician has requested that you have an echocardiogram. Echocardiography is a painless test that uses sound waves to create images of your heart. It provides your doctor with information about the size and shape of your heart and how well your heart's chambers and valves are working. This procedure takes approximately one hour. There are no restrictions for this procedure.  Your physician recommends that you return for a FASTING lipid profile /liver priofile in 2 months.   Your physician recommends that you schedule a follow-up appointment in: 3 months with Dr Aundra Dubin.

## 2014-02-04 ENCOUNTER — Encounter: Payer: Self-pay | Admitting: Cardiovascular Disease

## 2014-02-04 LAB — CULTURE, BLOOD (ROUTINE X 2)

## 2014-02-04 NOTE — Progress Notes (Signed)
Patient ID: Bryan Torres, male   DOB: Apr 16, 1961, 53 y.o.   MRN: [id number] PCP: Dr Harrington Challenger  53 yo with history of HTN presents for cardiology followup after recent admission for NSTEMI.  Patient had no prior cardiac history.  In 3/15, he was playing tennis one night.  After he got home, he developed severe substernal chest tightness.  The tightness did not resolve, so he came to the ER.  Cardiac enzymes were elevated.  He had a LHC.  This showed total occlusion of the distal LCx (codominant vessel).  In a complicated procedure, the distal LCx was opened with a DES.  EF was 60% on LV-gram.  Prior to the NSTEMI, patient had no history of chest pain or exertional dyspnea.  He played tennis 2-3 times a week. Interestingly, no direct family history of premature CAD but aunts/uncles/cousins have been diagnosed with CAD.    Since discharge, he has been doing well.  No chest pain.  No exertional dyspnea.  He is tolerating his medications.    ECG: NSR, inferior T wave inversions.    Labs (3/15): K 3.4, creatinine 1.16, LDL 92  PMH: 1. HTN 2. CAD: 3/15 NSTEMI.  LHC showed EF 60%, basal inferior severe hypokinesis, total occlusion of distal LCx (codominant vessel).  Patient had DES to distal LCx.    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  Lives in [place].    FH: Aunts, uncles, and cousins with CAD.  Parents and sibling without diagnosis of CAD.   ROS: All systems reviewed and negative except as per HPI.   Current Outpatient Prescriptions  Medication Sig Dispense Refill  . aspirin EC 81 MG EC tablet Take 1 tablet (81 mg total) by mouth daily.      Marland Kitchen atorvastatin (LIPITOR) 80 MG tablet Take 1 tablet (80 mg total) by mouth daily at 6 PM.  30 tablet  3  . hydrochlorothiazide (HYDRODIURIL) 25 MG tablet Take 12.5 mg by mouth daily.      . metoprolol tartrate (LOPRESSOR) 25 MG tablet Take 1 tablet (25 mg total) by mouth 2 (two) times daily.  60 tablet  3  . nitroGLYCERIN (NITROSTAT)  0.4 MG SL tablet Place 1 tablet (0.4 mg total) under the tongue every 5 (five) minutes x 3 doses as needed for chest pain.  25 tablet  3  . potassium chloride SA (K-DUR,KLOR-CON) 20 MEQ tablet Take 1 tablet (20 mEq total) by mouth daily.  30 tablet  3  . Ranitidine HCl (ACID REDUCER PO) Take 1-2 tablets by mouth daily as needed (stomach acid).      . Ticagrelor (BRILINTA) 90 MG TABS tablet Take 1 tablet (90 mg total) by mouth 2 (two) times daily.  60 tablet  3   No current facility-administered medications for this visit.    BP 132/78  Pulse 71  Ht 5' 8.5" (1.74 m)  Wt 91.627 kg (202 lb)  BMI 30.26 kg/m2 General: NAD Neck: No JVD, no thyromegaly or thyroid nodule.  Lungs: Clear to auscultation bilaterally with normal respiratory effort. CV: Nondisplaced PMI.  Heart regular S1/S2, no S3/S4, no murmur.  No peripheral edema.  No carotid bruit.  Normal pedal pulses.  Abdomen: Soft, nontender, no hepatosplenomegaly, no distention.  Skin: Intact without lesions or rashes.  Neurologic: Alert and oriented x 3.  Psych: Normal affect. Extremities: No clubbing or cyanosis. Right radial cath site benign.  Assessment/Plan: 1. CAD: s/p NSTEMI.  Doing well with no complaints.   -  Echo to assess LV function post-MI - He will start cardiac rehab.  - Continue ASA 81 and statin indefinitely.  - Ticagrelor x 1 year, then would consider long-term Plavix after year 1.  - Continue metoprolol.  He thinks he has not tolerated ACEI in the past.  2. HTN: BP is reasonably controlled.  3. Hyperlipidemia: Goal LDL < 70.  Lipids/LFTs in 2 months.   Bryan Torres 02/04/2014

## 2014-02-09 ENCOUNTER — Telehealth: Payer: Self-pay | Admitting: Cardiology

## 2014-02-09 NOTE — Telephone Encounter (Signed)
Follow Up:  Pt is asking for clarification on any limitations or restrictions that he may have.... Pt is requesting to speak to the nurse. Pt states he family is always asking him to sit down and rest.. Pt just wants to clarify what he can do.

## 2014-02-09 NOTE — Telephone Encounter (Signed)
LMTCB

## 2014-02-09 NOTE — Telephone Encounter (Signed)
Pt asking if OK with Dr Aundra Dubin to return work 02/15/14. Pt states Dr Aundra Dubin had previously told pt he should not lift more than 15 pounds. Pt would like to know if that is his only restriction. I will forward to Dr Aundra Dubin for review.

## 2014-02-09 NOTE — Telephone Encounter (Signed)
New message    Pt need a return to work to date in writing for disability/FMLA.  Call when note is ready and he will pick it up.

## 2014-02-09 NOTE — Telephone Encounter (Signed)
No lifting > 15 lbs for 2 wks after cath.  After that, would like him to keep it to < 40 lbs.  He can walk for exercise and gradually build up to more strenuous exercise over time.  Would like him to do cardiac rehab.  Can go back to work on 3/30.

## 2014-02-10 NOTE — Telephone Encounter (Signed)
Returned call to patient Dr.McLean advised don't lift over 15 lbs for the next 2 weeks after cath.Advised after 2 weeks lift less than 40 lbs.May walk for exercise gradually building up to more strenuous exercise.Patient stated he will be going to cardiac rehab.Advised may return to work 02/15/14.

## 2014-02-10 NOTE — Telephone Encounter (Signed)
New message     Pt is returning Anne's call from yesterday

## 2014-02-11 ENCOUNTER — Telehealth: Payer: Self-pay | Admitting: Cardiology

## 2014-02-11 NOTE — Telephone Encounter (Signed)
LMTCB

## 2014-02-11 NOTE — Telephone Encounter (Signed)
Follow Up  Pt called to discuss paperwork for rehab// no further details//Please call

## 2014-02-11 NOTE — Telephone Encounter (Signed)
Follow up ° ° ° ° ° °Returned Anne's call °

## 2014-02-11 NOTE — Telephone Encounter (Signed)
Spoke with patient. Order for Cardiac Rehab has been received by Verdis Frederickson in Cardiac Rehab. Pt should be ready to start Cardiac Rehab.

## 2014-02-12 ENCOUNTER — Encounter: Payer: Self-pay | Admitting: *Deleted

## 2014-02-12 NOTE — Telephone Encounter (Signed)
Pt comes in today for a letter detailing his recent hospitalization & follow-up with Dr. Aundra Dubin. I have typed a letter detailing the dates of hospital admission, DES, office follow-up as well as weight restrictions & return to work date.  Pt is still quite visibly stressed b/c Cardiac Rehab has not been set up. I called cardiac rehab prior to speaking with pt. They have not set a start date.  Pt states he felt & his HR manager thought he was supposed to receive some kind of rehabilitation & it should be documented in his chart in case anything else happened. He states that the [employer] he works for would not allow him to leave early/come in later for cardiac rehab.   Pt has joined a gym. He is walking & increasing his exercise daily. He feels that he is about 75-80 % back to normal physically. I told him that many people do work out with a Clinical research associate in a gym & this would be fine. He has the letter I typed for him stating weight restrictions. He is aware he may call as needed for further instructions.  He is visibly less stressed & anxious. Much reassurance given. This has been traumatic for him with his MI. "I had been doing everything I could before this happened" .  I have faxed the letter to his human resources department at pt request.  Forwarded to Dr. Aundra Dubin.  Horton Chin RN

## 2014-02-14 NOTE — Telephone Encounter (Signed)
Please make sure cardiac rehab follows up with him though it sounds like he will not be able to get off work for it.  He can work out at a gym if he does not do rehab, gradually and slowly increase activity level.

## 2014-02-15 ENCOUNTER — Encounter: Payer: Self-pay | Admitting: *Deleted

## 2014-02-15 ENCOUNTER — Telehealth: Payer: Self-pay | Admitting: Cardiology

## 2014-02-15 NOTE — Telephone Encounter (Signed)
Informed Sherilyn Dacosta, HR that patient able to return without restrictions. Will fax note to [phone]. Malachy Mood states she will inform patient that he can return to work tomorrow.

## 2014-02-15 NOTE — Telephone Encounter (Signed)
Informed patient

## 2014-02-15 NOTE — Telephone Encounter (Signed)
Let him return to work at this time with no restrictions.

## 2014-02-15 NOTE — Telephone Encounter (Signed)
Let him return to work at this time with no restrictions.   

## 2014-02-15 NOTE — Telephone Encounter (Signed)
Spoke with Mr. Deis.   His HR department and the nurse there would not allow patient to return to work with restrictions.  (even the 40 lb lifting restriction) He has to be released to full duty in order to be allowed to return. He states, "I am ready to return to work, I feel great". He left the contact info for HR and asked that we do whatever is necessary and let him know when he is allowed to return to work with no restrictions. Sherilyn Dacosta [phone] ext [phone] or [phone]

## 2014-02-15 NOTE — Telephone Encounter (Signed)
New message    Talk to the nurse regarding returning to work

## 2014-02-22 ENCOUNTER — Ambulatory Visit (HOSPITAL_COMMUNITY): Payer: BC Managed Care – PPO | Attending: Cardiology | Admitting: Cardiology

## 2014-02-22 ENCOUNTER — Telehealth (HOSPITAL_COMMUNITY): Payer: Self-pay | Admitting: *Deleted

## 2014-02-22 DIAGNOSIS — I2510 Atherosclerotic heart disease of native coronary artery without angina pectoris: Secondary | ICD-10-CM

## 2014-02-22 DIAGNOSIS — I214 Non-ST elevation (NSTEMI) myocardial infarction: Secondary | ICD-10-CM

## 2014-02-22 NOTE — Telephone Encounter (Signed)
    Patient Demographics     Patient Name Sex DOB SSN Address Phone    Bryan Torres, Bryan Torres Male [birth year] [id number] [place] [address] [id number] (Home) [phone] (Work) [phone] (Mobile)    Copied from 02/22/14 staff message.:          cardiac rehab Received: Today     Magda Kiel, RN Katrine Coho, RN            Good morning [name],   I received the referral last week.. Mr Mogel told me he is not interested in cardiac rehab at this time. He has returned to work and is exercising at the gym. I have cancelled the referral in epic.    Thanks for your assistance,   Have a good day   Verdis Frederickson

## 2014-02-22 NOTE — Telephone Encounter (Signed)
LM for Cardiac Rehab to call to confirm that order for Cardiac Rehab has been received. See 02/11/14 note that the order had been received.

## 2014-02-22 NOTE — Progress Notes (Signed)
Echo performed. 

## 2014-03-22 ENCOUNTER — Other Ambulatory Visit (INDEPENDENT_AMBULATORY_CARE_PROVIDER_SITE_OTHER): Payer: BC Managed Care – PPO

## 2014-03-22 DIAGNOSIS — I214 Non-ST elevation (NSTEMI) myocardial infarction: Secondary | ICD-10-CM

## 2014-03-22 LAB — HEPATIC FUNCTION PANEL
ALBUMIN: 4 g/dL (ref 3.5–5.2)
ALT: 33 U/L (ref 0–53)
AST: 32 U/L (ref 0–37)
Alkaline Phosphatase: 95 U/L (ref 39–117)
Bilirubin, Direct: 0.2 mg/dL (ref 0.0–0.3)
Total Bilirubin: 1.5 mg/dL — ABNORMAL HIGH (ref 0.3–1.2)
Total Protein: 7.1 g/dL (ref 6.0–8.3)

## 2014-03-22 LAB — LIPID PANEL
CHOLESTEROL: 86 mg/dL (ref 0–200)
HDL: 39.4 mg/dL (ref >39.00)
LDL Cholesterol: 37 mg/dL (ref 0–99)
TRIGLYCERIDES: 47 mg/dL (ref 0.0–149.0)
Total CHOL/HDL Ratio: 2
VLDL: 9.4 mg/dL (ref 0.0–40.0)

## 2014-03-26 ENCOUNTER — Telehealth: Payer: Self-pay | Admitting: Cardiology

## 2014-03-26 NOTE — Telephone Encounter (Signed)
Reviewed with Dr. Aundra Dubin and pt should resume Brilinta 90 mg by mouth twice daily. He should stop ASA for 4 days and then resume.  I spoke with pt and gave him this information.

## 2014-03-26 NOTE — Telephone Encounter (Signed)
Spoke with pt's wife who reports pt had blood vessel burst in left eye on 5/6. Went to doctor on 5/7 and was told to decrease Brilinta to half tablet. Wife thinks pt went to eye doctor but does not name of doctor. She reports he was told it should resolve but she does not know if follow up is planned with eye doctor. She states pt took half tablet Brilinta last night.  Will try to reach pt to get more information.  I spoke with pt who reports he saw eye doctor at [place]. (does not know name of MD). Was told he had small hemorrhage in eye and to take half tablet Brilinta twice daily for a few days. He is to follow up with eye doctor in 2 weeks.  Pt has not checked eye today to see if bleeding has improved. He is currently at work and unable to check eye.

## 2014-03-26 NOTE — Telephone Encounter (Signed)
New message ° ° ° ° °Want lab results °

## 2014-03-26 NOTE — Telephone Encounter (Signed)
New message     Pt is on byrlinta---blood vessel in eye burst--eye doctor told him to take a 1/2 pill. He usually takes his byrlinta at 10am---should he go back to 1 pill or stay with 1/2 pill?

## 2014-03-26 NOTE — Telephone Encounter (Signed)
Pt is aware of lab results Horton Chin RN

## 2014-03-26 NOTE — Telephone Encounter (Signed)
Patient went to the eye doctor this morning for red spot on his eye. He was told that the spot was coming from his Leilani Able and to stop taking it. Please call and advise.

## 2014-03-26 NOTE — Telephone Encounter (Signed)
Wife calling stating husband has a red spot in his (L) eye.  He saw "someone" yesterday and was told to take 1/2 dose of his Byrilinta.  States he took 1/2 dose yest and again today.  She called him while I was on the phone with him and he stated he had talked with someone in our office this AM.  He had spoke w/Pat Adelman,RN who advised him what to do per Dr. Aundra Dubin.

## 2014-03-26 NOTE — Telephone Encounter (Signed)
lmovm lab results Horton Chin RN

## 2014-04-30 ENCOUNTER — Encounter: Payer: Self-pay | Admitting: *Deleted

## 2014-04-30 ENCOUNTER — Ambulatory Visit (INDEPENDENT_AMBULATORY_CARE_PROVIDER_SITE_OTHER): Payer: BC Managed Care – PPO | Admitting: Cardiology

## 2014-04-30 ENCOUNTER — Encounter: Payer: Self-pay | Admitting: Cardiology

## 2014-04-30 VITALS — BP 128/80 | HR 64 | Ht 68.0 in | Wt 194.1 lb

## 2014-04-30 DIAGNOSIS — I2510 Atherosclerotic heart disease of native coronary artery without angina pectoris: Secondary | ICD-10-CM

## 2014-04-30 DIAGNOSIS — I10 Essential (primary) hypertension: Secondary | ICD-10-CM

## 2014-04-30 NOTE — Patient Instructions (Signed)
Your physician wants you to follow-up in: 6 months with Dr Aundra Dubin. (December 2015). You will receive a reminder letter in the mail two months in advance. If you don't receive a letter, please call our office to schedule the follow-up appointment.

## 2014-05-01 NOTE — Progress Notes (Signed)
Patient ID: Bryan Torres, male   DOB: 17-Apr-1961, 53 y.o.   MRN: [id number] PCP: Dr Harrington Challenger  53 yo with history of HTN presents for cardiology followup after recent admission for NSTEMI.  Patient had no prior cardiac history.  In 3/15, he was playing tennis one night.  After he got home, he developed severe substernal chest tightness.  The tightness did not resolve, so he came to the ER.  Cardiac enzymes were elevated.  He had a LHC.  This showed total occlusion of the distal LCx (codominant vessel).  In a complicated procedure, the distal LCx was opened with a DES.  EF was 60% on LV-gram.  Prior to the NSTEMI, patient had no history of chest pain or exertional dyspnea.  He played tennis 2-3 times a week. Interestingly, no direct family history of premature CAD but aunts/uncles/cousins have been diagnosed with CAD.  Echo (4/15) with EF 60-65%, mild LVH, basal inferolateral hypokinesis.   Since discharge, he has been doing well.  No chest pain.  No exertional dyspnea.  He is tolerating his medications.  He is back to playing tennis again and will work out on treadmill for 30 minutes at a time.   Labs (3/15): K 3.4, creatinine 1.16, LDL 92 Labs (5/15): LDL 37, HDL 39  PMH: 1. HTN 2. CAD: 3/15 NSTEMI.  LHC showed EF 60%, basal inferior severe hypokinesis, total occlusion of distal LCx (codominant vessel).  Patient had DES to distal LCx.  Echo (4/15) with EF 60-65%, mild LVH, basal inferolateral hypokinesis.   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  Lives in [place].    FH: Aunts, uncles, and cousins with CAD.  Parents and sibling without diagnosis of CAD.   ROS: All systems reviewed and negative except as per HPI.   Current Outpatient Prescriptions  Medication Sig Dispense Refill  . aspirin EC 81 MG EC tablet Take 1 tablet (81 mg total) by mouth daily.      Marland Kitchen atorvastatin (LIPITOR) 80 MG tablet Take 1 tablet (80 mg total) by mouth daily at 6 PM.  30 tablet  3  .  hydrochlorothiazide (HYDRODIURIL) 25 MG tablet Take 12.5 mg by mouth daily.      . metoprolol tartrate (LOPRESSOR) 25 MG tablet Take 1 tablet (25 mg total) by mouth 2 (two) times daily.  60 tablet  3  . nitroGLYCERIN (NITROSTAT) 0.4 MG SL tablet Place 1 tablet (0.4 mg total) under the tongue every 5 (five) minutes x 3 doses as needed for chest pain.  25 tablet  3  . potassium chloride SA (K-DUR,KLOR-CON) 20 MEQ tablet Take 1 tablet (20 mEq total) by mouth daily.  30 tablet  3  . Ranitidine HCl (ACID REDUCER PO) Take 1-2 tablets by mouth daily as needed (stomach acid).      . Ticagrelor (BRILINTA) 90 MG TABS tablet Take 1 tablet (90 mg total) by mouth 2 (two) times daily.  60 tablet  3   No current facility-administered medications for this visit.    BP 128/80  Pulse 64  Ht 5\' 8"  (1.727 m)  Wt 88.052 kg (194 lb 1.9 oz)  BMI 29.52 kg/m2 General: NAD Neck: No JVD, no thyromegaly or thyroid nodule.  Lungs: Clear to auscultation bilaterally with normal respiratory effort. CV: Nondisplaced PMI.  Heart regular S1/S2, +S4, no murmur.  No peripheral edema.  No carotid bruit.  Normal pedal pulses.  Abdomen: Soft, nontender, no hepatosplenomegaly, no distention.  Skin: Intact without  lesions or rashes.  Neurologic: Alert and oriented x 3.  Psych: Normal affect. Extremities: No clubbing or cyanosis. Right radial cath site benign.  Assessment/Plan: 1. CAD: s/p NSTEMI.  Doing well with no complaints.  EF 60-65% on echo in 4/15.  - Continue ASA 81 and statin indefinitely.  - Ticagrelor x 1 year, then would consider long-term Plavix after year 1.  - Continue metoprolol.  He thinks he has not tolerated ACEI in the past.  2. HTN: BP is reasonably controlled.  3. Hyperlipidemia: Good lipids in 5/15.    [name] 05/01/2014

## 2014-05-21 ENCOUNTER — Encounter (HOSPITAL_COMMUNITY): Payer: Self-pay | Admitting: Emergency Medicine

## 2014-05-21 ENCOUNTER — Observation Stay (HOSPITAL_COMMUNITY)
Admission: EM | Admit: 2014-05-21 | Discharge: 2014-05-22 | Disposition: A | Discharge status: Home / Self Care | Payer: BC Managed Care – PPO | Attending: Internal Medicine | Admitting: Internal Medicine

## 2014-05-21 ENCOUNTER — Telehealth: Payer: Self-pay | Admitting: Internal Medicine

## 2014-05-21 ENCOUNTER — Emergency Department (HOSPITAL_COMMUNITY): Payer: BC Managed Care – PPO

## 2014-05-21 DIAGNOSIS — I2510 Atherosclerotic heart disease of native coronary artery without angina pectoris: Secondary | ICD-10-CM | POA: Insufficient documentation

## 2014-05-21 DIAGNOSIS — I252 Old myocardial infarction: Secondary | ICD-10-CM

## 2014-05-21 DIAGNOSIS — I25118 Atherosclerotic heart disease of native coronary artery with other forms of angina pectoris: Secondary | ICD-10-CM

## 2014-05-21 DIAGNOSIS — I10 Essential (primary) hypertension: Secondary | ICD-10-CM | POA: Insufficient documentation

## 2014-05-21 DIAGNOSIS — Z79899 Other long term (current) drug therapy: Secondary | ICD-10-CM | POA: Insufficient documentation

## 2014-05-21 DIAGNOSIS — Z9861 Coronary angioplasty status: Secondary | ICD-10-CM | POA: Insufficient documentation

## 2014-05-21 DIAGNOSIS — K219 Gastro-esophageal reflux disease without esophagitis: Secondary | ICD-10-CM

## 2014-05-21 DIAGNOSIS — E785 Hyperlipidemia, unspecified: Secondary | ICD-10-CM | POA: Insufficient documentation

## 2014-05-21 DIAGNOSIS — R072 Precordial pain: Secondary | ICD-10-CM | POA: Diagnosis present

## 2014-05-21 DIAGNOSIS — R079 Chest pain, unspecified: Principal | ICD-10-CM | POA: Insufficient documentation

## 2014-05-21 DIAGNOSIS — Z7982 Long term (current) use of aspirin: Secondary | ICD-10-CM | POA: Insufficient documentation

## 2014-05-21 HISTORY — DX: Gastro-esophageal reflux disease without esophagitis: K21.9

## 2014-05-21 LAB — CBC
HCT: 39.1 % (ref 39.0–52.0)
Hemoglobin: 13.1 g/dL (ref 13.0–17.0)
MCH: 27 pg (ref 26.0–34.0)
MCHC: 33.5 g/dL (ref 30.0–36.0)
MCV: 80.5 fL (ref 78.0–100.0)
PLATELETS: 231 10*3/uL (ref 150–400)
RBC: 4.86 MIL/uL (ref 4.22–5.81)
RDW: 13.4 % (ref 11.5–15.5)
WBC: 5.5 10*3/uL (ref 4.0–10.5)

## 2014-05-21 LAB — I-STAT TROPONIN, ED: TROPONIN I, POC: 0.01 ng/mL (ref 0.00–0.08)

## 2014-05-21 MED ORDER — NITROGLYCERIN 0.4 MG SL SUBL
0.4000 mg | SUBLINGUAL_TABLET | Freq: Once | SUBLINGUAL | Status: AC
  Administered 2014-05-22: 0.4 mg via SUBLINGUAL
  Filled 2014-05-21: qty 1

## 2014-05-21 MED ORDER — SODIUM CHLORIDE 0.9 % IV SOLN
INTRAVENOUS | Status: DC
Start: 2014-05-22 — End: 2014-05-22
  Administered 2014-05-22: 20 mL/h via INTRAVENOUS

## 2014-05-21 NOTE — Telephone Encounter (Signed)
Pt called with chest pain similar to what he had prior to his MI described as ' indigestion ' . SL NTG X1 improved but did not resolve symptoms. Asked pt to call EMS and come to the ER.   [place] , M.D

## 2014-05-21 NOTE — ED Notes (Signed)
Pt states he has a felling of indigestion today that is a simialr pain to his previous heart attack

## 2014-05-21 NOTE — ED Provider Notes (Signed)
CSN: [id number]     Arrival date & time 05/21/14  2309 History   First MD Initiated Contact with Patient 05/21/14 2338     Chief Complaint  Patient presents with  . Chest Pain     (Consider location/radiation/quality/duration/timing/severity/associated sxs/prior Treatment) Patient is a 53 y.o. male presenting with chest pain. The history is provided by the patient and the spouse.  Chest Pain  patient complains of having epigastric pressure which shows her indigestion but is also similar to his prior MI. Review of your records shows that patient had an n- STEMI approximately 3 months ago. Today symptoms have been waxing or waning. They do not radiate. No associated dyspnea diaphoresis. No exertional component to them. Called his cardiologist and was told to take nitroglycerin which he did which made his symptoms go from one to a 0.5. He denies any fever or cough. No pleuritic component to this. No leg pain or swelling. No relief of his symptoms with burping.  Past Medical History  Diagnosis Date  . Hypertension   . CAD (coronary artery disease)     a. NSTEMI 01/2014 s/p DES-100% distal LCx  . Hyperlipidemia   . NSTEMI (non-ST elevated myocardial infarction)   . Hypokalemia    Past Surgical History  Procedure Laterality Date  . Knee surgery Right     reconstructive  . Percutaneous coronary stent intervention (pci-s)      40% ostial ramus, 100% distal LCx s/p DES, minor luminal irregularities elsewhere; 60-65%, there was basal inferior severe hypokinesis   No family history on file. History  Substance Use Topics  . Smoking status: Never Smoker   . Smokeless tobacco: Not on file  . Alcohol Use: Yes     Comment: occ    Review of Systems  Cardiovascular: Positive for chest pain.  All other systems reviewed and are negative.     Allergies  Review of patient's allergies indicates no known allergies.  Home Medications   Prior to Admission medications   Medication Sig Start  Date End Date Taking? Authorizing Provider  aspirin EC 81 MG EC tablet Take 1 tablet (81 mg total) by mouth daily. 01/30/14  Yes Roger A Arguello, PA-C  atorvastatin (LIPITOR) 80 MG tablet Take 1 tablet (80 mg total) by mouth daily at 6 PM. 01/30/14  Yes Roger A Arguello, PA-C  hydrochlorothiazide (HYDRODIURIL) 25 MG tablet Take 25 mg by mouth daily.   Yes Historical Provider, MD  metoprolol tartrate (LOPRESSOR) 25 MG tablet Take 12.5-25 mg by mouth 2 (two) times daily. Takes 1 tablet in the am and a half tablet in the pm.   Yes Historical Provider, MD  nitroGLYCERIN (NITROSTAT) 0.4 MG SL tablet Place 1 tablet (0.4 mg total) under the tongue every 5 (five) minutes x 3 doses as needed for chest pain. 01/30/14  Yes Roger A Arguello, PA-C  potassium chloride SA (K-DUR,KLOR-CON) 20 MEQ tablet Take 1 tablet (20 mEq total) by mouth daily. 01/30/14  Yes Roger A Arguello, PA-C  Ticagrelor (BRILINTA) 90 MG TABS tablet Take 1 tablet (90 mg total) by mouth 2 (two) times daily. 01/30/14  Yes Roger A Arguello, PA-C  Ranitidine HCl (ACID REDUCER PO) Take 1-2 tablets by mouth daily as needed (stomach acid).    Historical Provider, MD   BP 114/78  Pulse 76  Temp(Src) 97.7 F (36.5 C) (Oral)  Resp 21  Ht 5\' 8"  (1.727 m)  Wt 198 lb (89.812 kg)  BMI 30.11 kg/m2  SpO2 97% Physical Exam  Nursing note and vitals reviewed. Constitutional: He is oriented to person, place, and time. He appears well-developed and well-nourished.  Non-toxic appearance. No distress.  HENT:  Head: Normocephalic and atraumatic.  Eyes: Conjunctivae, EOM and lids are normal. Pupils are equal, round, and reactive to light.  Neck: Normal range of motion. Neck supple. No tracheal deviation present. No mass present.  Cardiovascular: Normal rate, regular rhythm and normal heart sounds.  Exam reveals no gallop.   No murmur heard. Pulmonary/Chest: Effort normal and breath sounds normal. No stridor. No respiratory distress. He has no decreased  breath sounds. He has no wheezes. He has no rhonchi. He has no rales.  Abdominal: Soft. Normal appearance and bowel sounds are normal. He exhibits no distension. There is no tenderness. There is no rebound and no CVA tenderness.  Musculoskeletal: Normal range of motion. He exhibits no edema and no tenderness.  Neurological: He is alert and oriented to person, place, and time. He has normal strength. No cranial nerve deficit or sensory deficit. GCS eye subscore is 4. GCS verbal subscore is 5. GCS motor subscore is 6.  Skin: Skin is warm and dry. No abrasion and no rash noted.  Psychiatric: He has a normal mood and affect. His speech is normal and behavior is normal.    ED Course  Procedures (including critical care time) Labs Review Labs Reviewed  CBC  COMPREHENSIVE METABOLIC PANEL  LIPASE, BLOOD  TROPONIN I  I-STAT TROPOININ, ED    Imaging Review No results found.   EKG Interpretation None      MDM   Final diagnoses:  None     Date: 05/21/2014  Rate: 74  Rhythm: normal sinus rhythm  QRS Axis: normal  Intervals: normal  ST/T Wave abnormalities: nonspecific T wave changes  Conduction Disutrbances:none  Narrative Interpretation:   Old EKG Reviewed: no change from 02/03/14   12:51 AM Patient given nitroglycerin sublingual and I discussed the case with the cardiologist on call and patient to be admitted for observation.   Leota Jacobsen, MD 05/22/14 [phone]

## 2014-05-22 ENCOUNTER — Other Ambulatory Visit (HOSPITAL_COMMUNITY): Payer: Self-pay | Admitting: Physician Assistant

## 2014-05-22 ENCOUNTER — Encounter (HOSPITAL_COMMUNITY): Payer: Self-pay | Admitting: Physician Assistant

## 2014-05-22 DIAGNOSIS — I10 Essential (primary) hypertension: Secondary | ICD-10-CM

## 2014-05-22 DIAGNOSIS — I252 Old myocardial infarction: Secondary | ICD-10-CM

## 2014-05-22 DIAGNOSIS — K219 Gastro-esophageal reflux disease without esophagitis: Secondary | ICD-10-CM

## 2014-05-22 DIAGNOSIS — I2510 Atherosclerotic heart disease of native coronary artery without angina pectoris: Secondary | ICD-10-CM

## 2014-05-22 DIAGNOSIS — R072 Precordial pain: Secondary | ICD-10-CM | POA: Diagnosis present

## 2014-05-22 DIAGNOSIS — R079 Chest pain, unspecified: Secondary | ICD-10-CM

## 2014-05-22 DIAGNOSIS — I209 Angina pectoris, unspecified: Secondary | ICD-10-CM

## 2014-05-22 LAB — COMPREHENSIVE METABOLIC PANEL
ALBUMIN: 3.9 g/dL (ref 3.5–5.2)
ALK PHOS: 122 U/L — AB (ref 39–117)
ALT: 32 U/L (ref 0–53)
AST: 31 U/L (ref 0–37)
Anion gap: 15 (ref 5–15)
BUN: 18 mg/dL (ref 6–23)
CO2: 25 mEq/L (ref 19–32)
CREATININE: 1.42 mg/dL — AB (ref 0.50–1.35)
Calcium: 9 mg/dL (ref 8.4–10.5)
Chloride: 101 mEq/L (ref 96–112)
GFR calc Af Amer: 64 mL/min — ABNORMAL LOW (ref >90)
GFR calc non Af Amer: 55 mL/min — ABNORMAL LOW (ref >90)
Glucose, Bld: 95 mg/dL (ref 70–99)
POTASSIUM: 3.8 meq/L (ref 3.7–5.3)
Sodium: 141 mEq/L (ref 137–147)
TOTAL PROTEIN: 7 g/dL (ref 6.0–8.3)
Total Bilirubin: 1 mg/dL (ref 0.3–1.2)

## 2014-05-22 LAB — CREATININE, SERUM
Creatinine, Ser: 1.32 mg/dL (ref 0.50–1.35)
GFR calc Af Amer: 70 mL/min — ABNORMAL LOW (ref >90)
GFR calc non Af Amer: 61 mL/min — ABNORMAL LOW (ref >90)

## 2014-05-22 LAB — CBC
HCT: 37.9 % — ABNORMAL LOW (ref 39.0–52.0)
Hemoglobin: 12.5 g/dL — ABNORMAL LOW (ref 13.0–17.0)
MCH: 26.8 pg (ref 26.0–34.0)
MCHC: 33 g/dL (ref 30.0–36.0)
MCV: 81.3 fL (ref 78.0–100.0)
PLATELETS: 203 10*3/uL (ref 150–400)
RBC: 4.66 MIL/uL (ref 4.22–5.81)
RDW: 13.5 % (ref 11.5–15.5)
WBC: 4.6 10*3/uL (ref 4.0–10.5)

## 2014-05-22 LAB — TROPONIN I
Troponin I: <0.3 ng/mL (ref <0.30)
Troponin I: <0.3 ng/mL (ref <0.30)

## 2014-05-22 LAB — LIPASE, BLOOD: Lipase: 25 U/L (ref 11–59)

## 2014-05-22 MED ORDER — ASPIRIN EC 81 MG PO TBEC
81.0000 mg | DELAYED_RELEASE_TABLET | Freq: Every day | ORAL | Status: DC
Start: 2014-05-22 — End: 2014-05-22
  Administered 2014-05-22: 81 mg via ORAL
  Filled 2014-05-22: qty 1

## 2014-05-22 MED ORDER — METOPROLOL TARTRATE 25 MG PO TABS
25.0000 mg | ORAL_TABLET | Freq: Two times a day (BID) | ORAL | Status: DC
  Administered 2014-05-22: 25 mg via ORAL
  Filled 2014-05-22 (×3): qty 1

## 2014-05-22 MED ORDER — FAMOTIDINE 20 MG PO TABS
20.0000 mg | ORAL_TABLET | Freq: Two times a day (BID) | ORAL | Status: DC
  Administered 2014-05-22: 20 mg via ORAL
  Filled 2014-05-22 (×3): qty 1

## 2014-05-22 MED ORDER — ACETAMINOPHEN 325 MG PO TABS
650.0000 mg | ORAL_TABLET | ORAL | Status: DC | PRN
Start: 2014-05-22 — End: 2014-05-22

## 2014-05-22 MED ORDER — ONDANSETRON HCL 4 MG/2ML IJ SOLN: 4.0000 mg | Freq: Four times a day (QID) | INTRAMUSCULAR | Status: DC | PRN

## 2014-05-22 MED ORDER — RANITIDINE HCL 75 MG PO TABS: 75.0000 mg | ORAL_TABLET | Freq: Two times a day (BID) | ORAL | Status: DC

## 2014-05-22 MED ORDER — ATORVASTATIN CALCIUM 80 MG PO TABS
80.0000 mg | ORAL_TABLET | Freq: Every day | ORAL | Status: DC
  Filled 2014-05-22: qty 1

## 2014-05-22 MED ORDER — POTASSIUM CHLORIDE CRYS ER 20 MEQ PO TBCR
20.0000 meq | EXTENDED_RELEASE_TABLET | Freq: Every day | ORAL | Status: DC
  Administered 2014-05-22: 20 meq via ORAL
  Filled 2014-05-22: qty 1

## 2014-05-22 MED ORDER — HYDROCHLOROTHIAZIDE 25 MG PO TABS
25.0000 mg | ORAL_TABLET | Freq: Every day | ORAL | Status: DC
  Administered 2014-05-22: 25 mg via ORAL
  Filled 2014-05-22: qty 1

## 2014-05-22 MED ORDER — TICAGRELOR 90 MG PO TABS
90.0000 mg | ORAL_TABLET | Freq: Two times a day (BID) | ORAL | Status: DC
  Administered 2014-05-22: 90 mg via ORAL
  Filled 2014-05-22 (×3): qty 1

## 2014-05-22 MED ORDER — HEPARIN SODIUM (PORCINE) 5000 UNIT/ML IJ SOLN
5000.0000 [IU] | Freq: Three times a day (TID) | INTRAMUSCULAR | Status: DC
  Administered 2014-05-22: 5000 [IU] via SUBCUTANEOUS
  Filled 2014-05-22 (×4): qty 1

## 2014-05-22 MED ORDER — NITROGLYCERIN 0.4 MG SL SUBL: 0.4000 mg | SUBLINGUAL_TABLET | SUBLINGUAL | Status: DC | PRN

## 2014-05-22 NOTE — ED Notes (Signed)
Report given to [place], RN

## 2014-05-22 NOTE — Discharge Summary (Signed)
Discharge Summary   Patient ID: Bryan Torres MRN: [id number], DOB/AGE: October 30, 1961 53 y.o. Admit date: 05/21/2014 D/C date:     05/22/2014  Primary Care Provider: Gus Height, MD Primary Cardiologist: Aundra Dubin  Primary Discharge Diagnoses:  1. Precordial chest pain suspected due to GERD 2. CAD with NSTEMI 01/2014 s/p DES-100% distal LCx on 01/28/14; nonobstructive dz elsewhere, EF 60-65, basal inferior severe HK  Secondary Discharge Diagnoses:  1. HTN 2. Hyperlipidemia 3. Hypokalemia, stable 4. Mild renal insufficiency, possibly chronic  Hospital Course: Bryan Torres is a 53 y/o M with history of CAD with NSTEMI 01/2014 s/p DES to Cx with nonobstructive disease elsewhere, HTN, GERD who presented to [facility] with chest discomfort. He has done well since his NSTEMI. He has been actively exercising and playing tennis. The day of admission after eating his meal he started having chest discomfort that felt like indigestion. It was similar to his discomfort that he had prior to his MI in 01/2014. With one tab SL NTG, the pain had mostly resolved. In retrospect he reported the pain had actually lasted >24 hours. He denied any exertional component or associated symptoms of SOB, nausea, diaphoresis, orthopnea, PND, LE edema, focal weakness, syncope, bleeding diathesis, or palpitations. Initial troponin was negative and EKG was unchanged. CXR was nonacute. Given his medical history he was admitted for further observation. Troponins remained negative. At this time his symptoms are felt possibly related to GERD. However, we have requested on the scheduling voicemail line that the patient have close followup in the office. If symptoms worsen or become more worrisome, further testing could take place. We have asked him to take his Ranitidine scheduled BID rather than PRN. Aside from this no other med changes were made at this time. Dr. Marlou Porch has seen and examined the patient today and feels he is stable  for discharge.   Discharge Vitals: Blood pressure 142/73, pulse 65, temperature 98.2 F (36.8 C), temperature source Oral, resp. rate 18, height 5\' 8"  (1.727 m), weight 201 lb 3.2 oz (91.264 kg), SpO2 97.00%.  Labs: Lab Results  Component Value Date   WBC 4.6 05/22/2014   HGB 12.5* 05/22/2014   HCT 37.9* 05/22/2014   MCV 81.3 05/22/2014   PLT 203 05/22/2014    Recent Labs Lab 05/21/14 2330 05/22/14 0230  NA 141  --   K 3.8  --   CL 101  --   CO2 25  --   BUN 18  --   CREATININE 1.42* 1.32  CALCIUM 9.0  --   PROT 7.0  --   BILITOT 1.0  --   ALKPHOS 122*  --   ALT 32  --   AST 31  --   GLUCOSE 95  --     Recent Labs  05/21/14 2330 05/22/14 0215 05/22/14 0730  TROPONINI <0.30 <0.30 <0.30   Lab Results  Component Value Date   CHOL 86 03/22/2014   HDL 39.40 03/22/2014   LDLCALC 37 03/22/2014   TRIG 47.0 03/22/2014     Diagnostic Studies/Procedures   Dg Chest Port 1 View  05/21/2014   CLINICAL DATA:  Chest pain.  EXAM: PORTABLE CHEST - 1 VIEW  COMPARISON:  01/29/2014.  FINDINGS: Normal heart size and mediastinal contours. No acute infiltrate or edema. No effusion or pneumothorax. No acute osseous findings.  IMPRESSION: No active disease.   Electronically Signed   By: Jorje Guild M.D.   On: 05/21/2014 23:52    Discharge Medications  Current Discharge Medication List    CONTINUE these medications which have CHANGED   Details  ranitidine (ACID REDUCER) 75 MG tablet Take 1 tablet (75 mg total) by mouth 2 (two) times daily. Qty: 60 tablet, Refills: 1      CONTINUE these medications which have NOT CHANGED   Details  aspirin EC 81 MG EC tablet Take 1 tablet (81 mg total) by mouth daily.    atorvastatin (LIPITOR) 80 MG tablet Take 1 tablet (80 mg total) by mouth daily at 6 PM.     hydrochlorothiazide (HYDRODIURIL) 25 MG tablet Take 25 mg by mouth daily.    metoprolol tartrate (LOPRESSOR) 25 MG tablet Take 25 mg by mouth 2 (two) times daily.     nitroGLYCERIN  (NITROSTAT) 0.4 MG SL tablet Place 1 tablet (0.4 mg total) under the tongue every 5 (five) minutes x 3 doses as needed for chest pain.     potassium chloride SA (K-DUR,KLOR-CON) 20 MEQ tablet Take 1 tablet (20 mEq total) by mouth daily.     Ticagrelor (BRILINTA) 90 MG TABS tablet Take 1 tablet (90 mg total) by mouth 2 (two) times daily.         Disposition   The patient will be discharged in stable condition to home. Discharge Instructions   Diet - low sodium heart healthy    Complete by:  As directed      Increase activity slowly    Complete by:  As directed           Follow-up Information   Follow up with [name], MD. (Our office will call you for a follow-up appointment. Please call the office if you have not heard from [place] within 3 days.)    Specialty:  Cardiology   Contact information:   [address] [place] [address] [phone]         Duration of Discharge Encounter: Greater than 30 minutes including physician and PA time.  Signed, Dayna Dunn PA-C 05/22/2014, 12:11 PM

## 2014-05-22 NOTE — Progress Notes (Signed)
  Primary Cardiologist: [name]    Subjective:  Doing well. No further chest pain. No SOB.   Objective:  Vital Signs in the last 24 hours: Temp:  [97.7 F (36.5 C)-98.2 F (36.8 C)] 98.2 F (36.8 C) (07/04 0500) Pulse Rate:  [60-78] 65 (07/04 0500) Resp:  [14-22] 18 (07/04 0500) BP: (107-149)/(61-88) 142/73 mmHg (07/04 0500) SpO2:  [91 %-99 %] 97 % (07/04 0500) Weight:  [198 lb (89.812 kg)-201 lb 3.2 oz (91.264 kg)] 201 lb 3.2 oz (91.264 kg) (07/04 0210)  Intake/Output from previous day:     Physical Exam: General: Well developed, well nourished, in no acute distress. Head:  Normocephalic and atraumatic. Lungs: Clear to auscultation and percussion. Heart: Normal S1 and S2.  No murmur, rubs or gallops.  Abdomen: soft, non-tender, positive bowel sounds. Extremities: No clubbing or cyanosis. No edema. Neurologic: Alert and oriented x 3.    Lab Results:  Recent Labs  05/21/14 2330 05/22/14 0230  WBC 5.5 4.6  HGB 13.1 12.5*  PLT 231 203    Recent Labs  05/21/14 2330 05/22/14 0230  NA 141  --   K 3.8  --   CL 101  --   CO2 25  --   GLUCOSE 95  --   BUN 18  --   CREATININE 1.42* 1.32    Recent Labs  05/22/14 0215 05/22/14 0730  TROPONINI <0.30 <0.30   Hepatic Function Panel  Recent Labs  05/21/14 2330  PROT 7.0  ALBUMIN 3.9  AST 31  ALT 32  ALKPHOS 122*  BILITOT 1.0   Imaging: Dg Chest Port 1 View  05/21/2014   CLINICAL DATA:  Chest pain.  EXAM: PORTABLE CHEST - 1 VIEW  COMPARISON:  01/29/2014.  FINDINGS: Normal heart size and mediastinal contours. No acute infiltrate or edema. No effusion or pneumothorax. No acute osseous findings.  IMPRESSION: No active disease.   Electronically Signed   By: Jorje Guild M.D.   On: 05/21/2014 23:52   Personally viewed.   Telemetry: No adverse rhythm Personally viewed.   EKG:  05/22/14 - inferolateral TWI unchanged from prior.   Cardiac Studies:  Cath 01/28/14 - distal LCX 100% occlusion Xience 2.25  x 8 DES.   Assessment/Plan:  Active Problems:   Precordial chest pain  1) CP - atypical . Negative Troponin. Reassuring. ECG no change per report. CP was > 24 hours. Ate bag of chips. Thinks it was GERD but was concerned because prior MI felt GERD like. We discussed options (stress test etc.) and decided that since his pain was atypical, no troponin, no change in ECG that we would DC home with close follow up in clinic. For now would forego stress testing.   If symptoms worsen or become more worrisome, further testing could take place. Both he and his wife are OK with plan.   2) GERD - pepcid.   3) HTN - stable.   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   OK for DC.      SKAINS, Nunda 05/22/2014, 11:25 AM

## 2014-05-22 NOTE — Discharge Summary (Signed)
Personally seen and examined. Agree with above. Troponin normal. ECG unchanged No further chest pain, possible GERD. See progress note for details if needed.  Candee Furbish, MD

## 2014-05-22 NOTE — H&P (Signed)
Cardiology Consultation Note  Patient ID: Bryan Torres, MRN: [id number], DOB/AGE: 1961-09-06 53 y.o. Admit date: 05/21/2014   Date of Consult: 05/22/2014 Primary Physician: Gus Height, MD Primary Cardiologist: [name]   Chief Complaint: chest pain    Assessment and Plan:  Chest pain - atypical  HTN  HLD   Plan  obs status on telemetry  Obtain serial CE   Continue home medication  Order PPI for possible GERD symptoms as well      53 yo with history of HTN ,CAD s/p LCx PCI in the setting of NSTEMI in 01/2014, nl EF here with chest pain   Pt states that he has been good health s/p PCI in 01/2104 actively [place] and playing tennis. Earlier today after eating his meal he starting having ' chest discomfort that feels like indigestion ' . It is similar to his discomfort that he had prior to his MI in 01/2014. With one tab SL ntg pain has mostly resolved. Pt denies any exertional component or ass symptoms of SOB , nausea diaphoresis.   ROS No orthopnea, PND , LE edema  focal weakness, syncope, bleeding diathesis , claudication , palpitation etc  Reports medication compliance  Past Medical History  Diagnosis Date  . Hypertension   . CAD (coronary artery disease)     a. NSTEMI 01/2014 s/p DES-100% distal LCx  . Hyperlipidemia   . NSTEMI (non-ST elevated myocardial infarction)   . 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 Nonsmoker. Rare ETOH. Lives in [place].  FH: Aunts, uncles, and cousins with CAD. Parents and sibling without diagnosis of CAD.    Most Recent Cardiac Studies: Echo 01/2014  - Left ventricle: The cavity size was normal. There was mild concentric hypertrophy. Systolic function was normal. The estimated ejection fraction was in the range of 60% to 65%. Mild posterior basal hypokinesis. Doppler parameters are consistent with abnormal left ventricular relaxation (grade 1 diastolic dysfunction). The E/e' ratio is <10, suggesting normal LV  filling pressure. - Mitral valve: Slight tethering of the posterior leaflet. Trace to mild regurgitation. - Left atrium: The atrium was normal in size.       Surgical History:  Past Surgical History  Procedure Laterality Date  . Knee surgery Right     reconstructive  . Percutaneous coronary stent intervention (pci-s)      40% ostial ramus, 100% distal LCx s/p DES, minor luminal irregularities elsewhere; 60-65%, there was basal inferior severe hypokinesis     Home Meds: Prior to Admission medications   Medication Sig Start Date End Date Taking? Authorizing Provider  aspirin EC 81 MG EC tablet Take 1 tablet (81 mg total) by mouth daily. 01/30/14  Yes Roger A Arguello, PA-C  atorvastatin (LIPITOR) 80 MG tablet Take 1 tablet (80 mg total) by mouth daily at 6 PM. 01/30/14  Yes Roger A Arguello, PA-C  hydrochlorothiazide (HYDRODIURIL) 25 MG tablet Take 25 mg by mouth daily.   Yes Historical Provider, MD  metoprolol tartrate (LOPRESSOR) 25 MG tablet Take 12.5-25 mg by mouth 2 (two) times daily. Takes 1 tablet in the am and a half tablet in the pm.   Yes Historical Provider, MD  nitroGLYCERIN (NITROSTAT) 0.4 MG SL tablet Place 1 tablet (0.4 mg total) under the tongue every 5 (five) minutes x 3 doses as needed for chest pain. 01/30/14  Yes Roger A Arguello, PA-C  potassium chloride SA (K-DUR,KLOR-CON) 20 MEQ tablet Take 1 tablet (20 mEq total) by mouth daily.  01/30/14  Yes Roger A Arguello, PA-C  Ticagrelor (BRILINTA) 90 MG TABS tablet Take 1 tablet (90 mg total) by mouth 2 (two) times daily. 01/30/14  Yes Roger A Arguello, PA-C  Ranitidine HCl (ACID REDUCER PO) Take 1-2 tablets by mouth daily as needed (stomach acid).    Historical Provider, MD    Inpatient Medications:    . sodium chloride      Allergies: No Known Allergies  History   Social History  . Marital Status: Single    Spouse Name: N/A    Number of Children: N/A  . Years of Education: N/A   Occupational History  . Quality  Inspector [employer]   Social History Main Topics  . Smoking status: Never Smoker   . Smokeless tobacco: Not on file  . Alcohol Use: Yes     Comment: occ  . Drug Use: No  . Sexual Activity: Not on file   Other Topics Concern  . Not on file   Social History Narrative  . No narrative on file     No family history on file.   Review of Systems: General: negative for chills, fever, night sweats or weight changes.  Cardiovascular: per HPI  Dermatological: negative for rash Respiratory: negative for cough or wheezing Urologic: negative for hematuria Abdominal: negative for nausea, vomiting, diarrhea, bright red blood per rectum, melena, or hematemesis Neurologic: negative for visual changes, syncope, or dizziness All other systems reviewed and are otherwise negative except as noted above.  Labs: No results found for this basename: CKTOTAL, CKMB, TROPONINI,  in the last 72 hours Lab Results  Component Value Date   WBC 5.5 05/21/2014   HGB 13.1 05/21/2014   HCT 39.1 05/21/2014   MCV 80.5 05/21/2014   PLT 231 05/21/2014   No results found for this basename: NA, K, CL, CO2, BUN, CREATININE, CALCIUM, LABALBU, PROT, BILITOT, ALKPHOS, ALT, AST, GLUCOSE,  in the last 168 hours Lab Results  Component Value Date   CHOL 86 03/22/2014   HDL 39.40 03/22/2014   LDLCALC 37 03/22/2014   TRIG 47.0 03/22/2014   No results found for this basename: DDIMER    Radiology/Studies:  Dg Chest Port 1 View  05/21/2014   CLINICAL DATA:  Chest pain.  EXAM: PORTABLE CHEST - 1 VIEW  COMPARISON:  01/29/2014.  FINDINGS: Normal heart size and mediastinal contours. No acute infiltrate or edema. No effusion or pneumothorax. No acute osseous findings.  IMPRESSION: No active disease.   Electronically Signed   By: Jorje Guild M.D.   On: 05/21/2014 23:52    EKG: 05/22/2014 NSR, inferolateral TWI unchanged in c/w EKG from 01/30/2014  Physical Exam: Blood pressure 124/79, pulse 71, temperature 97.7 F (36.5 C), temperature  source Oral, resp. rate 17, height 5\' 8"  (1.727 m), weight 89.812 kg (198 lb), SpO2 96.00%. General: Well developed, well nourished, in no acute distress.  Neck: Negative for carotid bruits. JVD not elevated. Lungs: Clear bilaterally to auscultation without wheezes, rales, or rhonchi. Breathing is unlabored. Heart: RRR with S1 S2. No murmurs, rubs, or gallops appreciated. Abdomen: Soft, non-tender, non-distended with normoactive bowel sounds. No hepatomegaly. No rebound/guarding. No obvious abdominal masses. Extremities: No clubbing or cyanosis. No edema.  Distal pedal pulses are 2+ and equal bilaterally. Neuro: Alert and oriented X 3. No facial asymmetry. No focal deficit. Moves all extremities spontaneously. Psych:  Responds to questions appropriately with a normal affect.       Cory Roughen, A M.D  05/22/2014, 12:30 AM

## 2014-05-24 ENCOUNTER — Telehealth: Payer: Self-pay | Admitting: Nurse Practitioner

## 2014-05-24 NOTE — Telephone Encounter (Signed)
New problem    TCM 06/02/14 @ 2pm sched per after hours.

## 2014-05-24 NOTE — Telephone Encounter (Signed)
TCM was d/c 7/4.  Spoke w/wife who states he is at work.  She states he is feeling much better. Still having some pain like he had before going to hospital.  Has not started the Rinitidine yet.  States she is going to pick the medication up today.  Advised he needs to start that ASAP.  She states he is taking all of his other medications as ordered.  Aware of appointment on 7/15 with Kathrene Alu.  Will call if has any further problems.

## 2014-05-25 NOTE — Telephone Encounter (Signed)
Rx was sent to pharmacy electronically. 

## 2014-06-02 ENCOUNTER — Encounter: Payer: BC Managed Care – PPO | Admitting: Nurse Practitioner

## 2014-06-14 ENCOUNTER — Other Ambulatory Visit (HOSPITAL_COMMUNITY): Payer: Self-pay | Admitting: Physician Assistant

## 2014-07-21 ENCOUNTER — Emergency Department (HOSPITAL_BASED_OUTPATIENT_CLINIC_OR_DEPARTMENT_OTHER)
Admission: EM | Admit: 2014-07-21 | Discharge: 2014-07-21 | Disposition: A | Discharge status: Home / Self Care | Payer: BC Managed Care – PPO | Attending: Emergency Medicine | Admitting: Emergency Medicine

## 2014-07-21 ENCOUNTER — Emergency Department (HOSPITAL_BASED_OUTPATIENT_CLINIC_OR_DEPARTMENT_OTHER): Payer: BC Managed Care – PPO

## 2014-07-21 ENCOUNTER — Encounter (HOSPITAL_BASED_OUTPATIENT_CLINIC_OR_DEPARTMENT_OTHER): Payer: Self-pay | Admitting: Emergency Medicine

## 2014-07-21 DIAGNOSIS — W298XXA Contact with other powered powered hand tools and household machinery, initial encounter: Secondary | ICD-10-CM | POA: Insufficient documentation

## 2014-07-21 DIAGNOSIS — I2510 Atherosclerotic heart disease of native coronary artery without angina pectoris: Secondary | ICD-10-CM | POA: Insufficient documentation

## 2014-07-21 DIAGNOSIS — S6990XA Unspecified injury of unspecified wrist, hand and finger(s), initial encounter: Secondary | ICD-10-CM | POA: Diagnosis present

## 2014-07-21 DIAGNOSIS — Z79899 Other long term (current) drug therapy: Secondary | ICD-10-CM | POA: Insufficient documentation

## 2014-07-21 DIAGNOSIS — I252 Old myocardial infarction: Secondary | ICD-10-CM | POA: Insufficient documentation

## 2014-07-21 DIAGNOSIS — S6980XA Other specified injuries of unspecified wrist, hand and finger(s), initial encounter: Secondary | ICD-10-CM | POA: Insufficient documentation

## 2014-07-21 DIAGNOSIS — Y92009 Unspecified place in unspecified non-institutional (private) residence as the place of occurrence of the external cause: Secondary | ICD-10-CM | POA: Insufficient documentation

## 2014-07-21 DIAGNOSIS — W230XXA Caught, crushed, jammed, or pinched between moving objects, initial encounter: Secondary | ICD-10-CM | POA: Diagnosis not present

## 2014-07-21 DIAGNOSIS — IMO0002 Reserved for concepts with insufficient information to code with codable children: Secondary | ICD-10-CM | POA: Diagnosis not present

## 2014-07-21 DIAGNOSIS — S62601B Fracture of unspecified phalanx of left index finger, initial encounter for open fracture: Secondary | ICD-10-CM

## 2014-07-21 DIAGNOSIS — Z9861 Coronary angioplasty status: Secondary | ICD-10-CM | POA: Insufficient documentation

## 2014-07-21 DIAGNOSIS — I10 Essential (primary) hypertension: Secondary | ICD-10-CM | POA: Diagnosis not present

## 2014-07-21 DIAGNOSIS — K219 Gastro-esophageal reflux disease without esophagitis: Secondary | ICD-10-CM | POA: Insufficient documentation

## 2014-07-21 DIAGNOSIS — Z23 Encounter for immunization: Secondary | ICD-10-CM | POA: Diagnosis not present

## 2014-07-21 DIAGNOSIS — E785 Hyperlipidemia, unspecified: Secondary | ICD-10-CM | POA: Diagnosis not present

## 2014-07-21 DIAGNOSIS — Z7982 Long term (current) use of aspirin: Secondary | ICD-10-CM | POA: Diagnosis not present

## 2014-07-21 DIAGNOSIS — Y9389 Activity, other specified: Secondary | ICD-10-CM | POA: Diagnosis not present

## 2014-07-21 MED ORDER — LIDOCAINE HCL 2 % IJ SOLN
INTRAMUSCULAR | Status: AC
  Filled 2014-07-21: qty 20

## 2014-07-21 MED ORDER — HYDROCODONE-ACETAMINOPHEN 5-325 MG PO TABS: 1.0000 | ORAL_TABLET | ORAL | Status: DC | PRN

## 2014-07-21 MED ORDER — TETANUS-DIPHTH-ACELL PERTUSSIS 5-2.5-18.5 LF-MCG/0.5 IM SUSP
0.5000 mL | Freq: Once | INTRAMUSCULAR | Status: AC
  Administered 2014-07-21: 0.5 mL via INTRAMUSCULAR
  Filled 2014-07-21: qty 0.5

## 2014-07-21 MED ORDER — CEPHALEXIN 250 MG PO CAPS
500.0000 mg | ORAL_CAPSULE | Freq: Once | ORAL | Status: AC
  Administered 2014-07-21: 500 mg via ORAL
  Filled 2014-07-21: qty 2

## 2014-07-21 MED ORDER — CEPHALEXIN 500 MG PO CAPS: 500.0000 mg | ORAL_CAPSULE | Freq: Four times a day (QID) | ORAL | Status: DC

## 2014-07-21 NOTE — ED Notes (Signed)
Mashed 1 st finger of left hand while putting nut on bolt w air wrench,   Bleeding controlled

## 2014-07-21 NOTE — ED Notes (Signed)
Injured left index finger screwing in a bolt that recoiled with bleeding noted to tip

## 2014-07-21 NOTE — ED Provider Notes (Signed)
CSN: [id number]     Arrival date & time 07/21/14  1957 History   This chart was scribed for Orlie Dakin, MD by Jeanell Sparrow, ED Scribe. This patient was seen in room MH11/MH11 and the patient's care was started at 9:55 PM. \  Chief Complaint  Patient presents with  . Finger Injury   The history is provided by the patient. No language interpreter was used.   HPI Comments: Bryan Torres is a 53 y.o. male who presents to the Emergency Department complaining of left index finger injury that occurred about 3 hours ago. He states that the injury occurred at home while screwing in a bolt with a power tool and his finger got caught and smashed. He reports associated pain and bleeding. He rates the severity of the pain currently as a 7/10. He reports that the pain has improved somewhat since the incident. He describes the pain as a throbbing sensation. He states that he takes aspirin, atorvastatin, Brilinta, hydrochlorothiazide, metoprolol tartrate, nitrostat, and potassium chloride. He reports that he has a hx of cardiac stent placement. He states that he is unsure of his Tetanus status. No other associated injuries  Past Medical History  Diagnosis Date  . Hypertension   . CAD (coronary artery disease)     a. NSTEMI 01/2014 s/p DES-100% distal LCx  . Hyperlipidemia   . NSTEMI (non-ST elevated myocardial infarction)   . Hypokalemia   . GERD (gastroesophageal reflux disease)    Past Surgical History  Procedure Laterality Date  . Knee surgery Right     reconstructive  . Percutaneous coronary stent intervention (pci-s)      40% ostial ramus, 100% distal LCx s/p DES, minor luminal irregularities elsewhere; 60-65%, there was basal inferior severe hypokinesis   No family history on file. History  Substance Use Topics  . Smoking status: Never Smoker   . Smokeless tobacco: Not on file  . Alcohol Use: Yes     Comment: occ    Review of Systems  Constitutional: Negative.   Skin: Positive for  wound.       Finger laceration    Allergies  Review of patient's allergies indicates no known allergies.  Home Medications   Prior to Admission medications   Medication Sig Start Date End Date Taking? Authorizing Provider  aspirin EC 81 MG EC tablet Take 1 tablet (81 mg total) by mouth daily. 01/30/14   Roger A Arguello, PA-C  atorvastatin (LIPITOR) 80 MG tablet TAKE ONE TABLET BY MOUTH ONE TIME DAILY at Pink, MD  BRILINTA 90 MG TABS tablet TAKE ONE TABLET BY MOUTH TWICE DAILY     Larey Dresser, MD  hydrochlorothiazide (HYDRODIURIL) 25 MG tablet Take 25 mg by mouth daily.    Historical Provider, MD  metoprolol tartrate (LOPRESSOR) 25 MG tablet TAKE ONE TABLET BY MOUTH TWICE DAILY     Larey Dresser, MD  NITROSTAT 0.4 MG SL tablet Place 1 tablet under the tongue every 5 minutes as needed for chest pain, max 3 doses, go to er if no relief    Larey Dresser, MD  potassium chloride SA (K-DUR,KLOR-CON) 20 MEQ tablet TAKE ONE TABLET BY MOUTH ONE TIME DAILY     Larey Dresser, MD  ranitidine (ACID REDUCER) 75 MG tablet Take 1 tablet (75 mg total) by mouth 2 (two) times daily. 05/22/14   Dayna N Dunn, PA-C   BP 164/99  Pulse 64  Temp(Src) 98.1 F (  36.7 C) (Oral)  Resp 20  Ht 5\' 8"  (1.727 m)  Wt 197 lb (89.359 kg)  BMI 29.96 kg/m2  SpO2 96% Physical Exam  Nursing note and vitals reviewed. Constitutional: He appears well-developed and well-nourished.  HENT:  Head: Normocephalic and atraumatic.  Eyes: EOM are normal.  Neck: Neck supple.  Cardiovascular: Normal rate.   Pulmonary/Chest: Effort normal.  Abdominal: He exhibits no distension.  Musculoskeletal:  Left hand flap 1cm laceration at index finger PIP joint ulnar aspect. Finger with full range of motion good capillary refill sensation intact    ED Course  Procedures (including critical care time) DIAGNOSTIC STUDIES: Oxygen Saturation is 96% on RA, normal by my interpretation.    COORDINATION OF CARE: 9:59  PM- Pt advised of plan for treatment and pt agrees.  Labs Review Labs Reviewed - No data to display  Imaging Review No results found. Procedure a digital block was performed of the left index finger with 2% lidocaine. The finger was irrigated copiously with tap water a nonstick dressing was placed over the wound and a finger splint applied which was comfortable for patient  EKG Interpretation None     X-ray viewed by me and discussed with radiologist MDM  Laceration is not amenable to repair  Final diagnoses:  None   Plan prescription Norco, keflex. Spoke with Dr. Caralyn Guile who will see patient tomorrow in the office Diagnosis open fracture left index finger   I personally performed the services described in this documentation, which was scribed in my presence. The recorded information has been reviewed and considered.    Orlie Dakin, MD 07/21/14 [phone]

## 2014-07-21 NOTE — Discharge Instructions (Signed)
Finger Fracture Take Tylenol for mild pain or the pain medicine prescribed for bad pain. Call Dr. Angus Palms office tomorrow morning to arrange to be seen in his office  tomorrow Fractures of fingers are breaks in the bones of the fingers. There are many types of fractures. There are different ways of treating these fractures. Your health care provider will discuss the best way to treat your fracture. CAUSES Traumatic injury is the main cause of broken fingers. These include:  Injuries while playing sports.  Workplace injuries.  Falls. RISK FACTORS Activities that can increase your risk of finger fractures include:  Sports.  Workplace activities that involve machinery.  A condition called osteoporosis, which can make your bones less dense and cause them to fracture more easily. SIGNS AND SYMPTOMS The main symptoms of a broken finger are pain and swelling within 15 minutes after the injury. Other symptoms include:  Bruising of your finger.  Stiffness of your finger.  Numbness of your finger.  Exposed bones (compound fracture) if the fracture is severe. DIAGNOSIS  The best way to diagnose a broken bone is with X-ray imaging. Additionally, your health care provider will use this X-ray image to evaluate the position of the broken finger bones.  TREATMENT  Finger fractures can be treated with:   Nonreduction--This means the bones are in place. The finger is splinted without changing the positions of the bone pieces. The splint is usually left on for about a week to 10 days. This will depend on your fracture and what your health care provider thinks.  Closed reduction--The bones are put back into position without using surgery. The finger is then splinted.  Open reduction and internal fixation--The fracture site is opened. Then the bone pieces are fixed into place with pins or some type of hardware. This is seldom required. It depends on the severity of the fracture. HOME CARE  INSTRUCTIONS   Follow your health care provider's instructions regarding activities, exercises, and physical therapy.  Only take over-the-counter or prescription medicines for pain, discomfort, or fever as directed by your health care provider. SEEK MEDICAL CARE IF: You have pain or swelling that limits the motion or use of your fingers. SEEK IMMEDIATE MEDICAL CARE IF:  Your finger becomes numb. MAKE SURE YOU:   Understand these instructions.  Will watch your condition.  Will get help right away if you are not doing well or get worse. Document Released: 02/17/2001 Document Revised: 08/26/2013 Document Reviewed: 06/17/2013 [facility] Patient Information 2015 [place], [place]. This information is not intended to replace advice given to you by your health care provider. Make sure you discuss any questions you have with your health care provider.

## 2014-10-27 ENCOUNTER — Ambulatory Visit (INDEPENDENT_AMBULATORY_CARE_PROVIDER_SITE_OTHER): Payer: BC Managed Care – PPO | Admitting: Cardiology

## 2014-10-27 ENCOUNTER — Encounter: Payer: Self-pay | Admitting: *Deleted

## 2014-10-27 VITALS — BP 142/90 | HR 63 | Ht 68.0 in | Wt 199.0 lb

## 2014-10-27 DIAGNOSIS — I2510 Atherosclerotic heart disease of native coronary artery without angina pectoris: Secondary | ICD-10-CM

## 2014-10-27 DIAGNOSIS — I10 Essential (primary) hypertension: Secondary | ICD-10-CM

## 2014-10-27 NOTE — Patient Instructions (Signed)
Your physician has requested that you regularly monitor and record your blood pressure readings at home. Please use the same machine at the same time of day to check your readings and record them. I will call you in about 2 weeks to get the readings.  Dr Aundra Dubin recommends that you take Brilinta through the end of March 2016. You can stop taking at the end of March 2016.   Your physician wants you to follow-up in: 6 months with Dr Aundra Dubin. (June 2016). You will receive a reminder letter in the mail two months in advance. If you don't receive a letter, please call our office to schedule the follow-up appointment.

## 2014-10-28 ENCOUNTER — Encounter (HOSPITAL_COMMUNITY): Payer: Self-pay | Admitting: Cardiology

## 2014-10-28 NOTE — Progress Notes (Signed)
Patient ID: Bryan Torres, male   DOB: 05/11/1961, 53 y.o.   MRN: [id number] PCP: Dr Harrington Challenger  53 yo with history of HTN presents for cardiology followup after recent admission for NSTEMI.  Patient had no prior cardiac history.  In 3/15, he was playing tennis one night.  After he got home, he developed severe substernal chest tightness.  The tightness did not resolve, so he came to the ER.  Cardiac enzymes were elevated.  He had a LHC.  This showed total occlusion of the distal LCx (codominant vessel).  In a complicated procedure, the distal LCx was opened with a DES.  EF was 60% on LV-gram.  Prior to the NSTEMI, patient had no history of chest pain or exertional dyspnea.  He played tennis 2-3 times a week. Interestingly, no direct family history of premature CAD but aunts/uncles/cousins have been diagnosed with CAD.  Echo (4/15) with EF 60-65%, mild LVH, basal inferolateral hypokinesis.   Since discharge, he has been doing well.  No chest pain.  No exertional dyspnea.  He is tolerating his medications.  He is back to playing tennis again and will work out on treadmill for 30 minutes at a time. BP is high today but he has been under a lot of stress at work.   Labs (3/15): K 3.4, creatinine 1.16, LDL 92 Labs (5/15): LDL 37, HDL 39  PMH: 1. HTN 2. CAD: 3/15 NSTEMI.  LHC showed EF 60%, basal inferior severe hypokinesis, total occlusion of distal LCx (codominant vessel).  Patient had DES to distal LCx.  Echo (4/15) with EF 60-65%, mild LVH, basal inferolateral hypokinesis.   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  Lives in [place].    FH: Aunts, uncles, and cousins with CAD.  Parents and sibling without diagnosis of CAD.   ROS: All systems reviewed and negative except as per HPI.   Current Outpatient Prescriptions  Medication Sig Dispense Refill  . aspirin EC 81 MG EC tablet Take 1 tablet (81 mg total) by mouth daily.    Marland Kitchen atorvastatin (LIPITOR) 80 MG tablet TAKE ONE TABLET BY  MOUTH ONE TIME DAILY at 6pm 30 tablet 5  . BRILINTA 90 MG TABS tablet TAKE ONE TABLET BY MOUTH TWICE DAILY  60 tablet 5  . hydrochlorothiazide (HYDRODIURIL) 25 MG tablet Take 25 mg by mouth daily.    Marland Kitchen HYDROcodone-acetaminophen (NORCO) 5-325 MG per tablet Take 1-2 tablets by mouth every 4 (four) hours as needed for severe pain. 20 tablet 0  . metoprolol tartrate (LOPRESSOR) 25 MG tablet TAKE ONE TABLET BY MOUTH TWICE DAILY  60 tablet 5  . NITROSTAT 0.4 MG SL tablet Place 1 tablet under the tongue every 5 minutes as needed for chest pain, max 3 doses, go to er if no relief 25 tablet 2  . potassium chloride SA (K-DUR,KLOR-CON) 20 MEQ tablet TAKE ONE TABLET BY MOUTH ONE TIME DAILY  30 tablet 5   No current facility-administered medications for this visit.    BP 142/90 mmHg  Pulse 63  Ht 5\' 8"  (1.727 m)  Wt 199 lb (90.266 kg)  BMI 30.26 kg/m2 General: NAD Neck: No JVD, no thyromegaly or thyroid nodule.  Lungs: Clear to auscultation bilaterally with normal respiratory effort. CV: Nondisplaced PMI.  Heart regular S1/S2, +S4, no murmur.  No peripheral edema.  No carotid bruit.  Normal pedal pulses.  Abdomen: Soft, nontender, no hepatosplenomegaly, no distention.  Skin: Intact without lesions or rashes.  Neurologic: Alert  and oriented x 3.  Psych: Normal affect. Extremities: No clubbing or cyanosis. Right radial cath site benign.  Assessment/Plan: 1. CAD: s/p NSTEMI.  Doing well with no complaints.  EF 60-65% on echo in 4/15.  - Continue ASA 81 and statin indefinitely.  - Ticagrelor x 1 year, can stop in 4/16.   - Continue metoprolol.  He thinks he has not tolerated ACEI in the past.  2. HTN: He will check BP daily and call us in 2 wks with readings.  3. Hyperlipidemia: Good lipids in 5/15.    Followup in 6 months.   [name] 10/28/2014

## 2014-11-09 ENCOUNTER — Telehealth: Payer: Self-pay | Admitting: *Deleted

## 2014-11-09 NOTE — Telephone Encounter (Signed)
Copied from Dr Claris Gladden 10/27/14  office note:  HTN: He will check BP daily and call us in 2 wks with readings.   11/09/14 LMTCB

## 2014-11-09 NOTE — Telephone Encounter (Signed)
Pt states he has been busy at work and has only checked his BP one time since office visit with Dr Aundra Dubin 10/27/14. Pt states his BP was OK when he checked it.  Pt states he will make an effort to check his BP regularly and call in about 10 days with the readings.

## 2014-12-07 ENCOUNTER — Other Ambulatory Visit: Payer: Self-pay | Admitting: Cardiology

## 2015-02-01 ENCOUNTER — Telehealth: Payer: Self-pay | Admitting: Cardiology

## 2015-02-01 NOTE — Telephone Encounter (Signed)
LMTCB

## 2015-02-01 NOTE — Telephone Encounter (Signed)
New message     Patient calling - in the process of starting new job . Will not have insurance for 15 days. The cost for other medication 800.00    In April suppose to be coming off medication.  Need clarification on what to do regarding his medication until his new insurance picks up.

## 2015-02-01 NOTE — Telephone Encounter (Signed)
F/U ° ° ° ° ° ° ° ° ° °Pt returning call. Please call back.  °

## 2015-02-03 NOTE — Telephone Encounter (Signed)
LMTCB

## 2015-02-04 NOTE — Telephone Encounter (Signed)
Pt states he changed jobs and does not have prescription coverage right now, he will have it by the end of the month.  Pt had been told he could stop Brilinta the end of March 2016, he has been out of it a week because of no prescription coverage right now ($800 out of pocket for Brilinta) . Pt asking if he can stop Brilinta now since he has already been out of it a week.  Pt advised I will forward to Dr Aundra Dubin

## 2015-02-04 NOTE — Telephone Encounter (Signed)
Can we give him samples to finish through the end of march?

## 2015-02-04 NOTE — Telephone Encounter (Signed)
Pt advised I will place enough samples of Brilinta 90mg  bid to take until the end of March 2016.

## 2015-03-16 ENCOUNTER — Telehealth: Payer: Self-pay | Admitting: Cardiology

## 2015-03-16 NOTE — Telephone Encounter (Signed)
New Message  Pt has and appt in September per recalls. Pt requests a call back to determine if he should wait that long for a follow up appt. Pt declined PA and NP//sr

## 2015-03-21 NOTE — Telephone Encounter (Signed)
I have scheduled pt to see Dr Aundra Dubin June 29,2016 at [facility] for pt

## 2015-03-21 NOTE — Telephone Encounter (Signed)
Pt aware appt with Dr Aundra Dubin 05/18/15 at [place].

## 2015-05-18 ENCOUNTER — Encounter: Payer: Self-pay | Admitting: *Deleted

## 2015-05-18 ENCOUNTER — Ambulatory Visit (INDEPENDENT_AMBULATORY_CARE_PROVIDER_SITE_OTHER): Payer: BLUE CROSS/BLUE SHIELD | Admitting: Cardiology

## 2015-05-18 ENCOUNTER — Encounter: Payer: Self-pay | Admitting: Cardiology

## 2015-05-18 VITALS — BP 132/82 | HR 61 | Ht 68.0 in | Wt 203.0 lb

## 2015-05-18 DIAGNOSIS — I2510 Atherosclerotic heart disease of native coronary artery without angina pectoris: Secondary | ICD-10-CM

## 2015-05-18 DIAGNOSIS — I10 Essential (primary) hypertension: Secondary | ICD-10-CM | POA: Diagnosis not present

## 2015-05-18 LAB — BASIC METABOLIC PANEL
BUN: 16 mg/dL (ref 6–23)
CHLORIDE: 105 meq/L (ref 96–112)
CO2: 28 mEq/L (ref 19–32)
Calcium: 9.2 mg/dL (ref 8.4–10.5)
Creatinine, Ser: 1.14 mg/dL (ref 0.40–1.50)
GFR: 86.22 mL/min (ref >60.00)
GLUCOSE: 83 mg/dL (ref 70–99)
Potassium: 3.9 mEq/L (ref 3.5–5.1)
Sodium: 141 mEq/L (ref 135–145)

## 2015-05-18 LAB — LIPID PANEL
CHOLESTEROL: 91 mg/dL (ref 0–200)
HDL: 39.3 mg/dL (ref >39.00)
LDL CALC: 41 mg/dL (ref 0–99)
NONHDL: 51.7
Total CHOL/HDL Ratio: 2
Triglycerides: 52 mg/dL (ref 0.0–149.0)
VLDL: 10.4 mg/dL (ref 0.0–40.0)

## 2015-05-18 MED ORDER — SILDENAFIL CITRATE 50 MG PO TABS: 50.0000 mg | ORAL_TABLET | Freq: Every day | ORAL | Status: DC | PRN

## 2015-05-18 NOTE — Patient Instructions (Signed)
Medication Instructions:  No changes today.  Labwork: BMET/Lipid profile today  Testing/Procedures: None today  Follow-Up: Your physician wants you to follow-up in:  1 year with Dr Aundra Dubin. (June 2017). You will receive a reminder letter in the mail two months in advance. If you don't receive a letter, please call our office to schedule the follow-up appointment.   Thank you for choosing [facility]!!

## 2015-05-19 NOTE — Progress Notes (Signed)
Patient ID: Bryan Torres, male   DOB: 02-01-1961, 54 y.o.   MRN: [id number] PCP: Dr Harrington Challenger  54 yo with history of HTN presents for cardiology followup after recent admission for NSTEMI.  Patient had no prior cardiac history.  In 3/15, he was playing tennis one night.  After he got home, he developed severe substernal chest tightness.  The tightness did not resolve, so he came to the ER.  Cardiac enzymes were elevated.  He had a LHC.  This showed total occlusion of the distal LCx (codominant vessel).  In a complicated procedure, the distal LCx was opened with a DES.  EF was 60% on LV-gram.  Prior to the NSTEMI, patient had no history of chest pain or exertional dyspnea.  He played tennis 2-3 times a week. Interestingly, no direct family history of premature CAD but aunts/uncles/cousins have been diagnosed with CAD.  Echo (4/15) with EF 60-65%, mild LVH, basal inferolateral hypokinesis.   He has been doing well.  No chest pain.  No exertional dyspnea.  He is tolerating his medications.  He is back to playing tennis again and also does some walking for exercise. BP controlled. He is now off Brilinta.   ECG: NSR, inferior TWIs  Labs (3/15): K 3.4, creatinine 1.16, LDL 92 Labs (5/15): LDL 37, HDL 39 Labs (7/15): HCT 37.9, K 3.8, creatinine 1.42  PMH: 1. HTN 2. CAD: 3/15 NSTEMI.  LHC showed EF 60%, basal inferior severe hypokinesis, total occlusion of distal LCx (codominant vessel).  Patient had DES to distal LCx.  Echo (4/15) with EF 60-65%, mild LVH, basal inferolateral hypokinesis.   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  Lives in [place].    FH: Aunts, uncles, and cousins with CAD.  Parents and sibling without diagnosis of CAD.   ROS: All systems reviewed and negative except as per HPI.   Current Outpatient Prescriptions  Medication Sig Dispense Refill  . aspirin EC 81 MG EC tablet Take 1 tablet (81 mg total) by mouth daily.    Marland Kitchen atorvastatin (LIPITOR) 80 MG tablet Take  1 tablet by mouth at 6 pm 30 tablet 4  . hydrochlorothiazide (HYDRODIURIL) 25 MG tablet Take 25 mg by mouth daily.    Marland Kitchen HYDROcodone-acetaminophen (NORCO) 5-325 MG per tablet Take 1-2 tablets by mouth every 4 (four) hours as needed for severe pain. 20 tablet 0  . metoprolol tartrate (LOPRESSOR) 25 MG tablet TAKE ONE TABLET BY MOUTH TWICE DAILY  60 tablet 4  . NITROSTAT 0.4 MG SL tablet Place 1 tablet under the tongue every 5 minutes as needed for chest pain, max 3 doses, go to er if no relief 25 tablet 2  . potassium chloride SA (K-DUR,KLOR-CON) 20 MEQ tablet TAKE ONE TABLET BY MOUTH ONE TIME DAILY  30 tablet 4  . sildenafil (VIAGRA) 50 MG tablet Take 1 tablet (50 mg total) by mouth daily as needed for erectile dysfunction. 10 tablet 0   No current facility-administered medications for this visit.    BP 132/82 mmHg  Pulse 61  Ht 5\' 8"  (1.727 m)  Wt 203 lb (92.08 kg)  BMI 30.87 kg/m2 General: NAD Neck: No JVD, no thyromegaly or thyroid nodule.  Lungs: Clear to auscultation bilaterally with normal respiratory effort. CV: Nondisplaced PMI.  Heart regular S1/S2, +S4, no murmur.  No peripheral edema.  No carotid bruit.  Normal pedal pulses.  Abdomen: Soft, nontender, no hepatosplenomegaly, no distention.  Skin: Intact without lesions or rashes.  Neurologic: Alert and oriented x 3.  Psych: Normal affect. Extremities: No clubbing or cyanosis.   Assessment/Plan: 1. CAD: s/p NSTEMI.  Doing well with no complaints.  EF 60-65% on echo in 4/15.  - Continue ASA 81 and statin indefinitely.  - Ticagrelor stopped after 1 year.   - Continue metoprolol.  He thinks he has not tolerated ACEI in the past.  2. HTN: BP currently appears controlled.   3. Hyperlipidemia: Check lipids today.   4. Erectile dysfunction: I will let him try Viagra. He was warned not to take nitrates concomitantly.   Followup in 1 year.   Bryan Torres 05/19/2015

## 2015-05-20 ENCOUNTER — Telehealth: Payer: Self-pay | Admitting: Cardiology

## 2015-05-20 NOTE — Telephone Encounter (Signed)
New problem   Pt returning a call from [place] from yesterday.

## 2015-05-20 NOTE — Telephone Encounter (Signed)
Please see results note. Patient given lab results.

## 2015-05-20 NOTE — Telephone Encounter (Signed)
Notes Recorded by Antonieta Iba, RN on 05/20/2015 at 8:44 AM Patient notified of lab results. Patient verbalized understanding and agreement to continue with current treatment plan. Patient would like to know if he can come off his medications at all since all labs are normal. Will route to Dr. Aundra Dubin and Desiree Lucy, RN, to address upon return to the office next week. Patient aware MD is not in clinic today.

## 2015-05-30 ENCOUNTER — Telehealth: Payer: Self-pay | Admitting: Cardiology

## 2015-05-30 NOTE — Telephone Encounter (Signed)
New message    Patient calling did not want to disclose with any information will discuss when the nurse called.

## 2015-05-30 NOTE — Telephone Encounter (Signed)
Spoke with patient about medications. 

## 2015-05-30 NOTE — Telephone Encounter (Signed)
LMTCB

## 2015-06-03 ENCOUNTER — Other Ambulatory Visit: Payer: Self-pay | Admitting: Cardiology

## 2015-06-13 ENCOUNTER — Telehealth: Payer: Self-pay | Admitting: Cardiology

## 2015-06-13 NOTE — Telephone Encounter (Deleted)
ERROR

## 2015-06-14 NOTE — Telephone Encounter (Signed)
Pt states viagra prescription is too expensive at his pharmacy.  I suggested pt call several pharmacies and get prices, pt advised I will can send prescription to a pharmacy where he finds it for less.

## 2015-06-14 NOTE — Telephone Encounter (Signed)
New message ° ° ° ° ° °Talk to the nurse----pt would not tell me what he wanted °

## 2015-06-15 ENCOUNTER — Telehealth: Payer: Self-pay | Admitting: Cardiology

## 2015-06-15 MED ORDER — SILDENAFIL CITRATE 20 MG PO TABS: 20.0000 mg | ORAL_TABLET | Freq: Every day | ORAL | Status: DC | PRN

## 2015-06-15 NOTE — Telephone Encounter (Signed)
New message     Pt returning Anne's call from yesterday. Please call to discuss

## 2015-06-15 NOTE — Telephone Encounter (Signed)
Spoke with Bryan Torres and he states that the [facility] on [address] said they had generic form of Viagra. [facility] [place] to [facility] had filled brand name. Pharmacy rep said that the only dose of Viagra that comes in generic right now is the 20mg , other dises will go generic in 2017. Spoke with Bryan Torres and he states that he is fine being on a different dose if Dr. Aundra Dubin is ok with that. Bryan Torres said if we change doses to [facility]. Will forward to Dr. Aundra Dubin and his nurse [name] for review, advisement and follow up.

## 2015-06-15 NOTE — Telephone Encounter (Addendum)
Pt called back and said that he spoke with his insurance and they said that they would be able to cover the medication but that they needed a prior authorization done prior to filling. Pt provided number [phone]. Will route this information to Jenean Lindau, our [place], to follow up.

## 2015-06-15 NOTE — Telephone Encounter (Signed)
Spoke with pt again and he asked that prescription be sent to [facility] on [address]. Prescription sent over to requested pharmacy.

## 2015-06-15 NOTE — Telephone Encounter (Signed)
That is fine 

## 2015-06-15 NOTE — Telephone Encounter (Signed)
Spoke with pt and informed him that Dr. Aundra Dubin said it was ok to switch pt to generic Viagra 20mg . Pt decided he wanted to call pharmacies and check prices. Pt states he will call back with which pharmacy to send it to.

## 2015-06-16 ENCOUNTER — Telehealth: Payer: Self-pay | Admitting: Cardiology

## 2015-07-04 ENCOUNTER — Telehealth: Payer: Self-pay | Admitting: Cardiology

## 2015-07-04 NOTE — Telephone Encounter (Signed)
New message    Pt calling about medication pre-authorization Pt states insurance stated they would pay for it Please call to discuss

## 2015-07-05 ENCOUNTER — Other Ambulatory Visit: Payer: Self-pay | Admitting: Cardiology

## 2015-07-05 NOTE — Telephone Encounter (Signed)
Follow up    Pt returning Linda's call

## 2015-07-07 NOTE — Telephone Encounter (Signed)
Follow up      Returning Linda's call.  OK to call tomorrow morning

## 2015-07-07 NOTE — Telephone Encounter (Signed)
F/u  Pt calling to speak w/ Vaughan Basta- mobile #. Please call back and discuss.

## 2015-07-08 ENCOUNTER — Other Ambulatory Visit: Payer: Self-pay

## 2015-07-08 ENCOUNTER — Telehealth: Payer: Self-pay

## 2015-07-08 MED ORDER — SILDENAFIL CITRATE 20 MG PO TABS: 20.0000 mg | ORAL_TABLET | Freq: Every day | ORAL | Status: DC | PRN

## 2015-07-08 NOTE — Telephone Encounter (Signed)
Refills on Sildenafil 20mg  [facility] [place]. PA in progress. Patient aware.

## 2015-07-08 NOTE — Telephone Encounter (Signed)
Prior auth for Sildenafil 20 mg sent to [facility] via Cover My Meds.

## 2015-07-13 ENCOUNTER — Telehealth: Payer: Self-pay | Admitting: Cardiology

## 2015-07-13 ENCOUNTER — Other Ambulatory Visit: Payer: Self-pay

## 2015-07-13 MED ORDER — POTASSIUM CHLORIDE CRYS ER 20 MEQ PO TBCR: 20.0000 meq | EXTENDED_RELEASE_TABLET | Freq: Every day | ORAL | Status: DC

## 2015-07-13 NOTE — Telephone Encounter (Signed)
New message      Pt states that it is important that he talks to [place] today.  Please call

## 2015-07-15 ENCOUNTER — Telehealth: Payer: Self-pay

## 2015-07-15 NOTE — Telephone Encounter (Signed)
Prior auth for Sildenafil 20 mg faxed to [employer].

## 2015-07-28 ENCOUNTER — Other Ambulatory Visit: Payer: Self-pay

## 2015-07-28 ENCOUNTER — Telehealth: Payer: Self-pay

## 2015-07-28 MED ORDER — SILDENAFIL CITRATE 50 MG PO TABS: 50.0000 mg | ORAL_TABLET | Freq: Every day | ORAL | Status: DC | PRN

## 2015-07-28 MED ORDER — SILDENAFIL CITRATE 20 MG PO TABS: 60.0000 mg | ORAL_TABLET | Freq: Every day | ORAL | Status: DC | PRN

## 2015-07-28 NOTE — Telephone Encounter (Signed)
Please disregard that message. Patient desires to stay with Sildenafil 20 and pay out of pocket.

## 2015-07-28 NOTE — Telephone Encounter (Signed)
Viagra 50mg  is covered by [employer], but at a $480 co/pay! Can we try Cialis? Pharmacist has a co/pay card he can use.

## 2015-07-28 NOTE — Telephone Encounter (Signed)
error 

## 2015-07-28 NOTE — Telephone Encounter (Signed)
That would be fine, lowest dose.

## 2015-08-19 ENCOUNTER — Ambulatory Visit: Payer: Self-pay | Admitting: Cardiology

## 2015-12-01 ENCOUNTER — Telehealth: Payer: Self-pay | Admitting: Cardiology

## 2015-12-01 NOTE — Telephone Encounter (Signed)
Left message to call back  

## 2015-12-01 NOTE — Telephone Encounter (Signed)
He can try increase of sildenafil to 100 mg prn.

## 2015-12-01 NOTE — Telephone Encounter (Signed)
New Message  Pt req a call back to discuss if it is ok to try Viagra with his current medications

## 2015-12-01 NOTE — Telephone Encounter (Signed)
Spoke with pt and advised him that Dr. Aundra Dubin said that he could try increasing his Sildenafil to 100mg  prn. Pt states that he has only been taking 1 tablet of the Sildenafil 20mg . Advised pt prescription was for him to take 3 to equal 60mg . Advised pt he could try taking the medication as it was originally prescribed. Pt states that he will try taking 2 and if that doesn't work then he will try 3. Advised pt to call our office if this continues to be a problem. Pt verbalized understanding.

## 2015-12-01 NOTE — Telephone Encounter (Signed)
Spoke with pt and states that he has been using the Sildenafil for awhile and it is not working as well as he would like for it to. Pt wants to know if there is any way that we can send in a prescription for name brand Viagra, Qty 1 pill for him to try and see if it works or if Dr. Aundra Dubin had any other suggestions. Advised pt that I would send information to Dr. Aundra Dubin for review.

## 2015-12-12 ENCOUNTER — Other Ambulatory Visit: Payer: Self-pay | Admitting: Cardiology

## 2016-01-07 ENCOUNTER — Other Ambulatory Visit: Payer: Self-pay | Admitting: Cardiology

## 2016-04-18 ENCOUNTER — Telehealth: Payer: Self-pay | Admitting: Cardiology

## 2016-04-18 NOTE — Telephone Encounter (Signed)
Stop metoprolol, start losartan 25 mg daily.

## 2016-04-18 NOTE — Telephone Encounter (Signed)
Pt states he is having fatigue, unhappy mood, erectile dysfunction. Pt states he read that these may be side effects of metoprolol. Pt asking about options for metoprolol since he feels his symptoms are side effects of the metoprolol Pt states he needs to do something because he is tired of feeling this way  Pt advised I will forward to Dr Aundra Dubin for review.

## 2016-04-18 NOTE — Telephone Encounter (Signed)
Bryan Torres is calling because he has some questions about his medication (Atorvastatin) .Marland Kitchen Please call   Thanks

## 2016-04-20 MED ORDER — LOSARTAN POTASSIUM 25 MG PO TABS: 25.0000 mg | ORAL_TABLET | Freq: Every day | ORAL | Status: DC

## 2016-04-20 NOTE — Telephone Encounter (Signed)
LMTCB for pt 

## 2016-04-20 NOTE — Telephone Encounter (Signed)
Pt advised,verbalized understanding. 

## 2016-04-24 ENCOUNTER — Telehealth: Payer: Self-pay | Admitting: Cardiology

## 2016-04-24 NOTE — Telephone Encounter (Signed)
Pt states he has been on losartan for 2 days. Pt states he has had cramps in his buttock down his leg since starting losartan, there is no pattern to cramps, they occur at any time.  Pt advised I did not think cramp related to losartan.  I offered pt appt either tomorrow, Thursday, or Friday to discuss medication and symptoms. Pt declined due to his schedule. Pt has been scheduled with Richardson Dopp 05/14/16.  Pt states he will continue losartan and monitor symptoms, plan to see [facility] 05/14/16

## 2016-04-24 NOTE — Telephone Encounter (Signed)
New message  Pt called states that he switched his BP meds and he would like to discuss some of the side effects. No further details.

## 2016-05-07 ENCOUNTER — Encounter: Payer: Self-pay | Admitting: Cardiology

## 2016-05-07 ENCOUNTER — Telehealth: Payer: Self-pay | Admitting: Cardiology

## 2016-05-07 MED ORDER — METOPROLOL TARTRATE 25 MG PO TABS: 25.0000 mg | ORAL_TABLET | Freq: Two times a day (BID) | ORAL | Status: DC

## 2016-05-07 NOTE — Telephone Encounter (Signed)
Pt states he had to change back to metoprolol tartrate 25mg  bid (previous dose)  from losartan because of tiredness and cramps.  Pt states he will continue metoprolol until appt 05/14/16 with Nicki Reaper then discuss medications further at that appt.

## 2016-05-07 NOTE — Telephone Encounter (Signed)
Metoprolol could be causing the fatigue, erectile dysfunction, etc that he mentioned before.  Other option would be using amlodipine 2.5 mg daily for HTN.  He could also just wait to appt with Scott as well.

## 2016-05-07 NOTE — Telephone Encounter (Signed)
New Message:  Pt called in stating that he wanted to switch from Losartan back to Metoprolol because he doesn't feel that it is working properly. Please f/u with him.

## 2016-05-09 DIAGNOSIS — M7711 Lateral epicondylitis, right elbow: Secondary | ICD-10-CM | POA: Diagnosis not present

## 2016-05-13 NOTE — Progress Notes (Signed)
Cardiology Office Note:    Date:  05/13/2016   ID:  Bryan Torres, DOB 10/04/1961, MRN [id number]  PCP:  Harle Battiest, MD  Cardiologist:  Dr. [name]   Electrophysiologist:  n/a  Referring MD: Harle Battiest, MD   Chief Complaint  Patient presents with  . Coronary Artery Disease    follow up    History of Present Illness:     Bryan Torres is a 55 y.o. male with a hx of  55 yo with history of HTN presents for cardiology followup after recent admission for NSTEMI. Patient had no prior cardiac history. In 3/15, he was playing tennis one night. After he got home, he developed severe substernal chest tightness. The tightness did not resolve, so he came to the ER. Cardiac enzymes were elevated. He had a LHC. This showed total occlusion of the distal LCx (codominant vessel). In a complicated procedure, the distal LCx was opened with a DES. EF was 60% on LV-gram. Prior to the NSTEMI, patient had no history of chest pain or exertional dyspnea. He played tennis 2-3 times a week. Interestingly, no direct family history of premature CAD but aunts/uncles/cousins have been diagnosed with CAD. Echo (4/15) with EF 60-65%, mild LVH, basal inferolateral hypokinesis.   He has been doing well. No chest pain. No exertional dyspnea. He is tolerating his medications. He is back to playing tennis again and also does some walking for exercise. BP controlled. He is now off Brilinta.   ECG: NSR, inferior TWIs  Labs (3/15): K 3.4, creatinine 1.16, LDL 92 Labs (5/15): LDL 37, HDL 39 Labs (7/15): HCT 37.9, K 3.8, creatinine 1.42  PMH: 1. HTN 2. CAD: 3/15 NSTEMI. LHC showed EF 60%, basal inferior severe hypokinesis, total occlusion of distal LCx (codominant vessel). Patient had DES to distal LCx. Echo (4/15) with EF 60-65%, mild LVH, basal inferolateral hypokinesis.   Past Medical History  Diagnosis Date  . Hypertension   . CAD (coronary artery disease)     a. NSTEMI 01/2014 s/p  DES-100% distal LCx  . Hyperlipidemia   . NSTEMI (non-ST elevated myocardial infarction)   . Hypokalemia   . GERD (gastroesophageal reflux disease)     Past Surgical History  Procedure Laterality Date  . Knee surgery Right     reconstructive  . Percutaneous coronary stent intervention (pci-s)      40% ostial ramus, 100% distal LCx s/p DES, minor luminal irregularities elsewhere; 60-65%, there was basal inferior severe hypokinesis  . Left heart catheterization with coronary angiogram N/A 01/28/2014    Procedure: LEFT HEART CATHETERIZATION WITH CORONARY ANGIOGRAM;  Surgeon: Larey Dresser, MD;  Location: [facility] CATH LAB;  Service: Cardiovascular;  Laterality: N/A;  . Percutaneous stent intervention  01/28/2014    Procedure: PERCUTANEOUS STENT INTERVENTION;  Surgeon: Larey Dresser, MD;  Location: [facility] CATH LAB;  Service: Cardiovascular;;    Current Medications: Outpatient Prescriptions Prior to Visit  Medication Sig Dispense Refill  . aspirin EC 81 MG EC tablet Take 1 tablet (81 mg total) by mouth daily.    Marland Kitchen atorvastatin (LIPITOR) 80 MG tablet TAKE ONE TABLET BY MOUTH AT 6PM 30 tablet 6  . hydrochlorothiazide (HYDRODIURIL) 25 MG tablet TAKE ONE TABLET BY MOUTH ONE TIME DAILY 90 tablet 1  . HYDROcodone-acetaminophen (NORCO) 5-325 MG per tablet Take 1-2 tablets by mouth every 4 (four) hours as needed for severe pain. 20 tablet 0  . metoprolol tartrate (LOPRESSOR) 25 MG tablet Take 1 tablet (25 mg total) by  mouth 2 (two) times daily. 60 tablet 1  . NITROSTAT 0.4 MG SL tablet Place 1 tablet under the tongue every 5 minutes as needed for chest pain, max 3 doses, go to er if no relief 25 tablet 2  . potassium chloride SA (K-DUR,KLOR-CON) 20 MEQ tablet Take 1 tablet (20 mEq total) by mouth daily. 30 tablet 10  . sildenafil (REVATIO) 20 MG tablet Take 3 tablets (60 mg total) by mouth daily as needed. For ED 15 tablet 3   No facility-administered medications prior to visit.      Allergies:   Review  of patient's allergies indicates no known allergies.   Social History   Social History  . Marital Status: Single    Spouse Name: N/A  . Number of Children: N/A  . Years of Education: N/A   Occupational History  . [employer] Inspector [employer]   Social History Main Topics  . Smoking status: Never Smoker   . Smokeless tobacco: Not on file  . Alcohol Use: Yes     Comment: occ  . Drug Use: No  . Sexual Activity: Not on file   Other Topics Concern  . Not on file   Social History Narrative     Family History:  The patient's family history includes Coronary artery disease in his cousin and other.   ROS:   Please see the history of present illness.    ROS All other systems reviewed and are negative.   Physical Exam:    VS:  There were no vitals taken for this visit.   Physical Exam  Wt Readings from Last 3 Encounters:  05/18/15 203 lb (92.08 kg)  10/27/14 199 lb (90.266 kg)  07/21/14 197 lb (89.359 kg)      Studies/Labs Reviewed:     EKG:  EKG is  ordered today.  The ekg ordered today demonstrates   Recent Labs: 05/18/2015: BUN 16; Creatinine, Ser 1.14; Potassium 3.9; Sodium 141   Recent Lipid Panel    Component Value Date/Time   CHOL 91 05/18/2015 1348   TRIG 52.0 05/18/2015 1348   HDL 39.30 05/18/2015 1348   CHOLHDL 2 05/18/2015 1348   VLDL 10.4 05/18/2015 1348   LDLCALC 41 05/18/2015 1348    Additional studies/ records that were reviewed today include:    Echo 02/22/14 - Left ventricle: The cavity size was normal. There was mild   concentric hypertrophy. Systolic function was normal. The   estimated ejection fraction was in the range of 60% to   65%. Mild posterior basal hypokinesis. Doppler parameters   are consistent with abnormal left ventricular relaxation   (grade 1 diastolic dysfunction). The E/e' ratio is <10,   suggesting normal LV filling pressure. - Mitral valve: Slight tethering of the posterior leaflet.   Trace to mild regurgitation. - Left  atrium: The atrium was normal in size.  LHC 01/28/14 Left mainstem: No significant disease.    Left anterior descending (LAD): Mild luminal irregularities.  Left circumflex (LCx): Codominant vessel.  Small ramus with 40% ostial narrowing. There was total occlusion of the distal LCx after a moderate PLOM.     Right coronary artery (RCA): Codominant vessel.  No significant disease.    Left ventriculography: Left ventricular systolic function is normal, LVEF is estimated at 60-65%, there was basal inferior severe hypokinesis.    Final Conclusions:  Culprit lesion is total occlusion of the distal LCx.  There is an associated wall motion abnormality.  I discussed patient with Dr. Claiborne Billings.  We will proceed with PCI after ticagrelor load.  PCI: Xience Alpine 2.25 x 8 mm DES to LCx  ASSESSMENT:     1. Coronary artery disease involving native coronary artery of native heart without angina pectoris   2. Essential hypertension   3. Hyperlipidemia     PLAN:     In order of problems listed above:  1.    Medication Adjustments/Labs and Tests Ordered: Current medicines are reviewed at length with the patient today.  Concerns regarding medicines are outlined above.  Medication changes, Labs and Tests ordered today are outlined in the Patient Instructions noted below. There are no Patient Instructions on file for this visit. Signed, Richardson Dopp, PA-C  05/13/2016 10:40 PM    [facility] [address], [place], [place]  [address] Phone: [phone]; Fax: [phone]     This encounter was created in error - please disregard.

## 2016-05-14 ENCOUNTER — Telehealth: Payer: Self-pay | Admitting: Cardiology

## 2016-05-14 ENCOUNTER — Encounter: Payer: BLUE CROSS/BLUE SHIELD | Admitting: Physician Assistant

## 2016-06-12 ENCOUNTER — Other Ambulatory Visit: Payer: Self-pay | Admitting: *Deleted

## 2016-06-12 DIAGNOSIS — Z0000 Encounter for general adult medical examination without abnormal findings: Secondary | ICD-10-CM | POA: Diagnosis not present

## 2016-06-12 DIAGNOSIS — N529 Male erectile dysfunction, unspecified: Secondary | ICD-10-CM | POA: Diagnosis not present

## 2016-06-12 DIAGNOSIS — I10 Essential (primary) hypertension: Secondary | ICD-10-CM | POA: Diagnosis not present

## 2016-06-12 DIAGNOSIS — M179 Osteoarthritis of knee, unspecified: Secondary | ICD-10-CM | POA: Diagnosis not present

## 2016-06-12 DIAGNOSIS — E7800 Pure hypercholesterolemia, unspecified: Secondary | ICD-10-CM | POA: Diagnosis not present

## 2016-06-12 MED ORDER — METOPROLOL TARTRATE 25 MG PO TABS: 25.0000 mg | ORAL_TABLET | Freq: Two times a day (BID) | ORAL | 0 refills | Status: DC

## 2016-06-22 DIAGNOSIS — I10 Essential (primary) hypertension: Secondary | ICD-10-CM | POA: Diagnosis not present

## 2016-06-22 DIAGNOSIS — Z0000 Encounter for general adult medical examination without abnormal findings: Secondary | ICD-10-CM | POA: Diagnosis not present

## 2016-06-22 DIAGNOSIS — E7800 Pure hypercholesterolemia, unspecified: Secondary | ICD-10-CM | POA: Diagnosis not present

## 2016-07-02 ENCOUNTER — Other Ambulatory Visit: Payer: Self-pay | Admitting: Cardiology

## 2016-07-03 ENCOUNTER — Other Ambulatory Visit: Payer: Self-pay | Admitting: Cardiology

## 2016-07-10 DIAGNOSIS — J209 Acute bronchitis, unspecified: Secondary | ICD-10-CM | POA: Diagnosis not present

## 2016-07-27 DIAGNOSIS — N5201 Erectile dysfunction due to arterial insufficiency: Secondary | ICD-10-CM | POA: Diagnosis not present

## 2016-07-27 DIAGNOSIS — R972 Elevated prostate specific antigen [PSA]: Secondary | ICD-10-CM | POA: Diagnosis not present

## 2016-07-27 DIAGNOSIS — R3912 Poor urinary stream: Secondary | ICD-10-CM | POA: Diagnosis not present

## 2016-08-14 ENCOUNTER — Other Ambulatory Visit: Payer: Self-pay | Admitting: Cardiology

## 2016-08-30 DIAGNOSIS — R634 Abnormal weight loss: Secondary | ICD-10-CM | POA: Diagnosis not present

## 2016-09-13 ENCOUNTER — Other Ambulatory Visit: Payer: Self-pay | Admitting: Cardiology

## 2016-09-15 ENCOUNTER — Other Ambulatory Visit: Payer: Self-pay | Admitting: Cardiology

## 2016-09-24 ENCOUNTER — Other Ambulatory Visit: Payer: Self-pay

## 2016-09-24 MED ORDER — HYDROCHLOROTHIAZIDE 25 MG PO TABS: 25.0000 mg | ORAL_TABLET | Freq: Every day | ORAL | 0 refills | Status: AC

## 2016-09-25 DIAGNOSIS — R972 Elevated prostate specific antigen [PSA]: Secondary | ICD-10-CM | POA: Diagnosis not present

## 2016-09-25 DIAGNOSIS — R3912 Poor urinary stream: Secondary | ICD-10-CM | POA: Diagnosis not present

## 2016-09-25 DIAGNOSIS — N4289 Other specified disorders of prostate: Secondary | ICD-10-CM | POA: Diagnosis not present

## 2016-09-25 DIAGNOSIS — N411 Chronic prostatitis: Secondary | ICD-10-CM | POA: Diagnosis not present

## 2016-10-22 ENCOUNTER — Telehealth: Payer: Self-pay | Admitting: Cardiology

## 2016-10-22 NOTE — Telephone Encounter (Signed)
Mr.Sakata is calling because he wants speak with you about a Medication eval. Thinks one of his medications is causing severe depression and wants to see if there is something else he could take . Please call .Marland Kitchen Thanks

## 2016-10-22 NOTE — Telephone Encounter (Signed)
LMTCB

## 2016-10-24 ENCOUNTER — Other Ambulatory Visit: Payer: Self-pay | Admitting: Cardiology

## 2016-10-26 NOTE — Telephone Encounter (Signed)
Follow up ° °Pt voiced returning nurses call. ° °Please f/u °

## 2016-10-26 NOTE — Telephone Encounter (Signed)
LMTCB

## 2016-11-05 NOTE — Telephone Encounter (Signed)
Pt not at home number listed, LMVM on mobile number listed.

## 2016-11-07 ENCOUNTER — Telehealth: Payer: Self-pay | Admitting: Cardiology

## 2016-11-07 NOTE — Telephone Encounter (Signed)
No DPR on file for pt's wife, left message with her to ask pt to call.

## 2016-11-07 NOTE — Telephone Encounter (Signed)
New message  Pt's wife is calling to set up appt w/Dr. Aundra Dubin  Pt was told to call Desiree Lucy   Please call back

## 2016-11-07 NOTE — Telephone Encounter (Signed)
See phone note 10/22/16

## 2016-11-18 ENCOUNTER — Other Ambulatory Visit: Payer: Self-pay | Admitting: Cardiology

## 2016-11-28 ENCOUNTER — Other Ambulatory Visit: Payer: Self-pay | Admitting: Cardiology

## 2016-12-14 ENCOUNTER — Other Ambulatory Visit: Payer: Self-pay | Admitting: Cardiology

## 2016-12-24 ENCOUNTER — Ambulatory Visit (INDEPENDENT_AMBULATORY_CARE_PROVIDER_SITE_OTHER): Payer: BLUE CROSS/BLUE SHIELD | Admitting: Internal Medicine

## 2016-12-24 ENCOUNTER — Encounter: Payer: Self-pay | Admitting: *Deleted

## 2016-12-24 ENCOUNTER — Encounter: Payer: Self-pay | Admitting: Internal Medicine

## 2016-12-24 VITALS — BP 140/106 | HR 63 | Ht 68.0 in | Wt 200.2 lb

## 2016-12-24 DIAGNOSIS — I2510 Atherosclerotic heart disease of native coronary artery without angina pectoris: Secondary | ICD-10-CM

## 2016-12-24 DIAGNOSIS — I10 Essential (primary) hypertension: Secondary | ICD-10-CM | POA: Diagnosis not present

## 2016-12-24 DIAGNOSIS — N529 Male erectile dysfunction, unspecified: Secondary | ICD-10-CM

## 2016-12-24 DIAGNOSIS — E785 Hyperlipidemia, unspecified: Secondary | ICD-10-CM

## 2016-12-24 MED ORDER — AMLODIPINE BESYLATE 5 MG PO TABS: 5.0000 mg | ORAL_TABLET | Freq: Every day | ORAL | 1 refills | Status: DC

## 2016-12-24 MED ORDER — SILDENAFIL CITRATE 20 MG PO TABS: ORAL_TABLET | ORAL | 3 refills | Status: DC

## 2016-12-24 NOTE — Patient Instructions (Signed)
Medication Instructions:  Stop metoprolol.  Start amlodipine 5 mg daily  Labwork: None   Testing/Procedures: None   Follow-Up: Your physician recommends that you schedule a follow-up appointment in: 2-3 months with Dr End.         If you need a refill on your cardiac medications before your next appointment, please call your pharmacy.

## 2016-12-24 NOTE — Progress Notes (Signed)
Follow-up Outpatient Visit Date: 12/24/2016  Primary Care Provider: Melinda Crutch, MD [place] [address]  Chief Complaint: Follow-up coronary artery disease  HPI:  Bryan Torres is a 56 y.o. year-old male with history of coronary artery disease status post NSTEMI and PCI to distal LCx/2015, hypertension, hyperlipidemia, and erectile dysfunction, who presents for follow-up of CAD. He was last seen in our office by Dr. Aundra Dubin on 05/18/15. He remains very active and has not experienced any episodes of chest pain or shortness of breath. He is concerned about potential side effects related to the medications that he is currently taking. In particular, he has noticed fatigue and mild depression. He stopped taking all his medications except for aspirin for a two-week period about a month ago, with resolution of these symptoms. He is now back on his current medications and is feeling "blue" again.Marland Kitchen He took a cold medication last night due to URI symptoms and believes this may be contributing to his elevated blood pressure. He notes that his blood pressure at home is typically normal. He did not check his blood pressure while off his antihypertensive medications last month.  Bryan Torres denies palpitations, lightheadedness, edema, orthopnea, and PND. He remains active, exercising and playing tennis on a regular basis. He watches his diet carefully, trying to avoid red meats. Reports routine labs (including lipid panel) within the last year with his PCP, Dr. Harrington Challenger.  --------------------------------------------------------------------------------------------------  Cardiovascular History & Procedures: Cardiovascular Problems:  Coronary artery disease status post NSTEMI (01/2014)  Risk Factors:  Known coronary artery disease, hypertension, hyperlipidemia, male gender, and age > 59  Cath/PCI:  LHC/PCI (01/28/14): Codominant system. LMCA normal. LAD with mild luminal irregularities. Ramus  with 40% ostial stenosis. LCx with a total occlusion of the distal vessel after a moderate PL branch. Normal RCA. LVEF 60-65% with basal inferior hypokinesis. Difficult but ultimately successful PCI to the distal LCx with a Xience Alpine 2.25 x 8 mm drug-eluting stent.  CV Surgery:  None  EP Procedures and Devices:  None  Non-Invasive Evaluation(s):  TTE (02/22/14): Normal LV size with mild LVH. LVEF 60-65% with mild posterior basal hypokinesis. Grade 1 diastolic dysfunction. Mild tethering of the posterior mitral valve leaflet with trace to mild MR. Normal RV size and function.  Recent CV Pertinent Labs: Lab Results  Component Value Date   CHOL 91 05/18/2015   HDL 39.30 05/18/2015   LDLCALC 41 05/18/2015   TRIG 52.0 05/18/2015   CHOLHDL 2 05/18/2015   INR 1.01 01/28/2014   K 3.9 05/18/2015   BUN 16 05/18/2015   CREATININE 1.14 05/18/2015    Past medical and surgical history were reviewed and updated in EPIC.  Outpatient Encounter Prescriptions as of 12/24/2016  Medication Sig  . aspirin EC 81 MG EC tablet Take 1 tablet (81 mg total) by mouth daily.  Marland Kitchen atorvastatin (LIPITOR) 80 MG tablet TAKE ONE TABLET BY MOUTH AT 6PM  . hydrochlorothiazide (HYDRODIURIL) 25 MG tablet Take 1 tablet (25 mg total) by mouth daily.  Marland Kitchen NITROSTAT 0.4 MG SL tablet Place 1 tablet under the tongue every 5 minutes as needed for chest pain, max 3 doses, go to er if no relief  . potassium chloride SA (K-DUR,KLOR-CON) 20 MEQ tablet TAKE 1 TABLET (20 MEQ TOTAL) BY MOUTH DAILY.  . sildenafil (REVATIO) 20 MG tablet TAKE THREE TABLETS BY MOUTH ONCE DAILY AS NEEDED FOR  ED  . [DISCONTINUED] metoprolol tartrate (LOPRESSOR) 25 MG tablet Take 1 tablet (25 mg  total) by mouth 2 (two) times daily. *Please keep upcoming appointment*  . [DISCONTINUED] sildenafil (REVATIO) 20 MG tablet TAKE THREE TABLETS BY MOUTH ONCE DAILY AS NEEDED FOR  ED  . amLODipine (NORVASC) 5 MG tablet Take 1 tablet (5 mg total) by mouth daily.  .  [DISCONTINUED] amLODipine (NORVASC) 5 MG tablet Take 1 tablet (5 mg total) by mouth daily.  . [DISCONTINUED] HYDROcodone-acetaminophen (NORCO) 5-325 MG per tablet Take 1-2 tablets by mouth every 4 (four) hours as needed for severe pain.   No facility-administered encounter medications on file as of 12/24/2016.     Allergies: Patient has no known allergies.  Social History   Social History  . Marital status: Single    Spouse name: N/A  . Number of children: N/A  . Years of education: N/A   Occupational History  .  [employer]   Social History Main Topics  . Smoking status: Never Smoker  . Smokeless tobacco: Never Used  . Alcohol use Yes     Comment: occ  . Drug use: No  . Sexual activity: Not on file   Other Topics Concern  . Not on file   Social History Narrative  . No narrative on file    Family History  Problem Relation Age of Onset  . Lung disease Mother   . Hypertension Mother   . Other Father 43    agent  . Hypertension Father   . Coronary artery disease Cousin     multiple  . Coronary artery disease Other     aunts and uncles  . Stroke Sister 66  . Stroke Maternal Grandmother     Review of Systems: A 12-system review of systems was performed and was negative except as noted in the HPI.  --------------------------------------------------------------------------------------------------  Physical Exam: BP (!) 140/106 (BP Location: Left Arm, Patient Position: Sitting, Cuff Size: Large)   Pulse 63   Ht 5\' 8"  (1.727 m)   Wt 200 lb 3.2 oz (90.8 kg)   SpO2 96%   BMI 30.44 kg/m   General:  Well-developed, well-nourished man seated comfortably in the exam room. HEENT: No conjunctival pallor or scleral icterus.  Moist mucous membranes.  OP clear. Neck: Supple without lymphadenopathy, thyromegaly, JVD, or HJR.  No carotid bruit. Lungs: Normal work of breathing.  Clear to auscultation bilaterally without wheezes or crackles. Heart: Regular rate and  rhythm without murmurs, rubs, or gallops.  Non-displaced PMI. Abd: Bowel sounds present.  Soft, NT/ND without hepatosplenomegaly Ext: No lower extremity edema.  Radial, PT, and DP pulses are 2+ bilaterally. Skin: warm and dry without rash  EKG:  Normal sinus rhythm without significant abnormalities. Previously noted T-wave changes in inferior leads are no longer present (I have personally reviewed the tracings from today and 05/18/15).  Lab Results  Component Value Date   WBC 4.6 05/22/2014   HGB 12.5 (L) 05/22/2014   HCT 37.9 (L) 05/22/2014   MCV 81.3 05/22/2014   PLT 203 05/22/2014    Lab Results  Component Value Date   NA 141 05/18/2015   K 3.9 05/18/2015   CL 105 05/18/2015   CO2 28 05/18/2015   BUN 16 05/18/2015   CREATININE 1.14 05/18/2015   GLUCOSE 83 05/18/2015   ALT 32 05/21/2014    Lab Results  Component Value Date   CHOL 91 05/18/2015   HDL 39.30 05/18/2015   LDLCALC 41 05/18/2015   TRIG 52.0 05/18/2015   CHOLHDL 2 05/18/2015    --------------------------------------------------------------------------------------------------  ASSESSMENT AND PLAN: Coronary artery disease without angina Bryan Torres has done well following PCI to the distal LCx, without chest pain or other limitations. He is exercising regularly and watching his diet. We will continue with life-long aspirin, as well as secondary prevention (as outlined below).  Hypertension Blood pressure is notably elevated on exam today. Some of this may be due to having taken a cold medication last night. Bryan Torres is concerned about side effects from his medications. I suspect the most likely culprit for his fatigue and depression is metoprolol. After discussing the risks and benefits, we have agreed to discontinue metoprolol and start amlodipine 5 mg daily. He will contact us if his fatigue and depression do not improve.  Hyperlipidemia Most recent LDL in our system was quite good, though it is more than  a year old. Bryan Torres reports having labs done by Dr. Harrington Challenger within the last 12 months; we will request a copy of these records. In the meantime, I have encouraged Bryan Torres to continue atorvastatin. If his fatigue does not improve with discontinuation of metoprolol, we could consider a statin holiday or switching to an alternative agent.  Erectile dysfunction This is likely multifactorial, including underlying vascular disease and medication side effects. We have agreed to discontinue metoprolol. I have refilled the patient's prescription for sildenafil. He was advised not to take nitroglycerin if he has used sildenafil within the last 24 hours. I also recommended that he follow-up with his urologist for further management of this.  Follow-up: Return to clinic in 2-3 months.  Nelva Bush, MD 12/25/2016 9:31 PM

## 2016-12-25 ENCOUNTER — Encounter: Payer: Self-pay | Admitting: Internal Medicine

## 2016-12-25 DIAGNOSIS — N529 Male erectile dysfunction, unspecified: Secondary | ICD-10-CM | POA: Insufficient documentation

## 2017-01-01 ENCOUNTER — Telehealth: Payer: Self-pay | Admitting: Internal Medicine

## 2017-01-01 NOTE — Telephone Encounter (Signed)
I spoke with Bryan Torres, who reports that amlodipine caused vision problems. Fatigue and muscle aches have improved with discontinuation of metoprolol. Due to BP being elevated at 140/95 yesterday, he has increased HCTZ from 12.5 mg daily -> 25 mg daily. I advised him to continue with the higher dose of HCTZ and to monitor his BP over the next few weeks.  If it is consistently above 140/90, he will contact us so that we can consider starting carvedilol.  Nelva Bush, MD [facility] Pager: [phone]

## 2017-01-01 NOTE — Telephone Encounter (Signed)
Patient is calling complaining of side effects of starting the amlodipine. The patient was seen by Dr. Saunders Revel on 12/24/16 and his metoprolol was d/c'd due to fatigue and depression and was started on amlodipine 5 mg daily. The patient states that he cannot take amlodipine because it affects his equilibrium and causes him to have double vision while he is driving. He states that he called his PCP Dr. Lawerance Cruel at [facility] and they told him that it is in his records that he cannot take this medicine. The patient was upset that Dr. Saunders Revel did not have his records from [place] with all of his medication allergies. I have filled out a form requesting the patient's records and medical records is going to request this information from [place]. The patient states that he has stopped taking the amlodipine for now. His BP yesterday was 140/95. The patient is wanting to know if he should restart the metoprolol or if he she start another medication. The patient states that he can't take other BP meds, but does not know which ones. Please advise.

## 2017-01-01 NOTE — Telephone Encounter (Signed)
New message      Talk to the nurse about recent bp medication that was prescribed.  Pt did not know the name of it but said it was in his chart.  Please call

## 2017-01-07 ENCOUNTER — Telehealth: Payer: Self-pay | Admitting: Internal Medicine

## 2017-01-07 NOTE — Telephone Encounter (Signed)
New Message  Requesting call from nurse.   Pt c/o medication issue:  1. Name of Medication: Amlodipine  2. How are you currently taking this medication (dosage and times per day)? Once a day  3. Are you having a reaction (difficulty breathing--STAT)? Dizziness, weakness, Double vision  4. What is your medication issue? Per pt stopped taking medication, and started back taking metoprolol as well as a whole   hydrochlorothiazide (HYDRODIURIL) 25 MG tablet   Per pt doesn't feel like either medication is working

## 2017-01-07 NOTE — Telephone Encounter (Signed)
Please let Bryan Torres know that he can start taking carvedilol in place of metoprolol, as we discussed by phone last week. I would suggest starting at 6.25 mg BID, which we can titrate up as tolerated. If he has further concerns or questions, I suggest that he speak with his PCP and make a list of all antihypertensives he has tried (as he has numerous intolerances). He can then schedule an office appointment at his convenience to review further treatment options. Thanks.  Bryan Bush, MD [facility] Pager: [phone]

## 2017-01-07 NOTE — Telephone Encounter (Signed)
Pt states he has been taking HCTZ 25mg  daily. Pt states his BP off amlodipine and metoprolol was 154/96.

## 2017-01-07 NOTE — Telephone Encounter (Signed)
Pt feels his BP needs to under better control. Pt asking to speak with Dr End about options for BP management. Pt advised I will forward to Dr End for review, that Dr End would probably be in touch with him tomorrow.

## 2017-01-07 NOTE — Telephone Encounter (Signed)
Pt states he restarted metoprolol 25 mg bid on Saturday, pt states BP yesterday was 156/94, he has not checked BP today.  Pt does not know HR. Pt states he his vision is off and is complaining of headaches.   Pt advised I will forward to Dr End for review.

## 2017-01-09 MED ORDER — CARVEDILOL 6.25 MG PO TABS: 6.2500 mg | ORAL_TABLET | Freq: Two times a day (BID) | ORAL | 1 refills | Status: DC

## 2017-01-09 NOTE — Telephone Encounter (Signed)
Discussed Dr Darnelle Bos recommendations with patient. Pt is willing to change from metoprolol to coreg 6.25mg  bid. Pt will continue to monitor BP and call if still elevated.  Pt will call his PCP and get a list of hyertensives that he has been intolerant to in the past so he will have these for future reference.

## 2017-01-09 NOTE — Addendum Note (Signed)
Addended by: Katrine Coho on: 01/09/2017 03:33 PM   Modules accepted: Orders

## 2017-01-09 NOTE — Telephone Encounter (Signed)
LMTCB

## 2017-01-10 ENCOUNTER — Telehealth: Payer: Self-pay | Admitting: Internal Medicine

## 2017-01-10 DIAGNOSIS — I10 Essential (primary) hypertension: Secondary | ICD-10-CM | POA: Diagnosis not present

## 2017-01-10 DIAGNOSIS — R3911 Hesitancy of micturition: Secondary | ICD-10-CM | POA: Diagnosis not present

## 2017-01-10 NOTE — Telephone Encounter (Signed)
Spoke with patient about trying coreg -pt states he will try coreg and he will also speak with his PCP about BP management. Pt advised to decide on one provider to manage his BP and stick with them.

## 2017-01-10 NOTE — Telephone Encounter (Signed)
New message      Pt request to talk to the nurse.  He would not tell me what he wanted.

## 2017-01-18 ENCOUNTER — Other Ambulatory Visit: Payer: Self-pay | Admitting: Cardiology

## 2017-01-22 ENCOUNTER — Other Ambulatory Visit: Payer: Self-pay | Admitting: Cardiology

## 2017-01-24 DIAGNOSIS — E7800 Pure hypercholesterolemia, unspecified: Secondary | ICD-10-CM | POA: Diagnosis not present

## 2017-01-24 DIAGNOSIS — I10 Essential (primary) hypertension: Secondary | ICD-10-CM | POA: Diagnosis not present

## 2017-01-24 DIAGNOSIS — R3911 Hesitancy of micturition: Secondary | ICD-10-CM | POA: Diagnosis not present

## 2017-01-24 DIAGNOSIS — I252 Old myocardial infarction: Secondary | ICD-10-CM | POA: Diagnosis not present

## 2017-02-21 ENCOUNTER — Encounter: Payer: Self-pay | Admitting: Internal Medicine

## 2017-03-08 ENCOUNTER — Encounter: Payer: Self-pay | Admitting: Internal Medicine

## 2017-03-08 ENCOUNTER — Ambulatory Visit (INDEPENDENT_AMBULATORY_CARE_PROVIDER_SITE_OTHER): Payer: Commercial Managed Care - PPO | Admitting: Internal Medicine

## 2017-03-08 VITALS — BP 122/80 | HR 99 | Ht 68.0 in | Wt 190.0 lb

## 2017-03-08 DIAGNOSIS — I10 Essential (primary) hypertension: Secondary | ICD-10-CM

## 2017-03-08 DIAGNOSIS — I2510 Atherosclerotic heart disease of native coronary artery without angina pectoris: Secondary | ICD-10-CM

## 2017-03-08 DIAGNOSIS — N529 Male erectile dysfunction, unspecified: Secondary | ICD-10-CM | POA: Diagnosis not present

## 2017-03-08 DIAGNOSIS — E785 Hyperlipidemia, unspecified: Secondary | ICD-10-CM

## 2017-03-08 NOTE — Progress Notes (Signed)
Follow-up Outpatient Visit Date: 03/08/2017  Primary Care Provider: Melinda Crutch, MD [place] [address]  Chief Complaint: Follow-up coronary artery disease and hypertension  HPI:  Mr. Evinger is a 56 y.o. year-old male with history of coronary artery disease status post NSTEMI and PCI to distal LCx/2015, hypertension, hyperlipidemia, and erectile dysfunction, who presents for follow-up of CAD and hypertension. I last saw him on 12/24/16, which time we spoke at length regarding his suboptimally controlled blood pressure and history of multiple medication side effects. He ultimately decided to de-escalate his regimen to HCTZ alone after consulting with Dr. Harrington Challenger. Mr. Camilli notes that his energy has been much improved. His only complaint is of intermittent erectile dysfunction. He is planning to see urology soon for further evaluation.  Mr. Ashurst exercises regularly without any limitations. He specifically denies chest pain, shortness of breath, palpitations, lightheadedness, and leg edema. Home blood pressures have been relatively well controlled, though a few high readings are noted. He believes that less stress from switching jobs is helping him feel better and control his blood pressure.  --------------------------------------------------------------------------------------------------  Cardiovascular History & Procedures: Cardiovascular Problems:  Coronary artery disease status post NSTEMI (01/2014)  Risk Factors:  Known coronary artery disease, hypertension, hyperlipidemia, male gender, and age > 3  Cath/PCI:  LHC/PCI (01/28/14): Codominant system. LMCA normal. LAD with mild luminal irregularities. Ramus with 40% ostial stenosis. LCx with a total occlusion of the distal vessel after a moderate PL branch. Normal RCA. LVEF 60-65% with basal inferior hypokinesis. Difficult but ultimately successful PCI to the distal LCx with a Xience Alpine 2.25 x 8 mm drug-eluting  stent.  CV Surgery:  None  EP Procedures and Devices:  None  Non-Invasive Evaluation(s):  TTE (02/22/14): Normal LV size with mild LVH. LVEF 60-65% with mild posterior basal hypokinesis. Grade 1 diastolic dysfunction. Mild tethering of the posterior mitral valve leaflet with trace to mild MR. Normal RV size and function.  Recent CV Pertinent Labs: Lab Results  Component Value Date   CHOL 91 05/18/2015   HDL 39.30 05/18/2015   LDLCALC 41 05/18/2015   TRIG 52.0 05/18/2015   CHOLHDL 2 05/18/2015   INR 1.01 01/28/2014   K 3.9 05/18/2015   BUN 16 05/18/2015   CREATININE 1.14 05/18/2015    Past medical and surgical history were reviewed and updated in EPIC.  Outpatient Encounter Prescriptions as of 03/08/2017  Medication Sig  . aspirin EC 81 MG EC tablet Take 1 tablet (81 mg total) by mouth daily.  Marland Kitchen atorvastatin (LIPITOR) 80 MG tablet Take 1 tablet (80 mg total) by mouth daily at 6 PM.  . hydrochlorothiazide (HYDRODIURIL) 25 MG tablet Take 1 tablet (25 mg total) by mouth daily.  . potassium chloride SA (K-DUR,KLOR-CON) 20 MEQ tablet Take 20 mEq by mouth every evening.  . tamsulosin (FLOMAX) 0.4 MG CAPS capsule Take 0.4 mg by mouth every evening.  . [DISCONTINUED] carvedilol (COREG) 6.25 MG tablet Take 1 tablet (6.25 mg total) by mouth 2 (two) times daily. (Patient not taking: Reported on 03/08/2017)  . [DISCONTINUED] hydrochlorothiazide (HYDRODIURIL) 25 MG tablet TAKE 1 TABLET BY MOUTH EVERY DAY (Patient not taking: Reported on 03/08/2017)  . [DISCONTINUED] NITROSTAT 0.4 MG SL tablet Place 1 tablet under the tongue every 5 minutes as needed for chest pain, max 3 doses, go to er if no relief (Patient not taking: Reported on 03/08/2017)  . [DISCONTINUED] potassium chloride SA (K-DUR,KLOR-CON) 20 MEQ tablet TAKE 1 TABLET (20 MEQ TOTAL) BY  MOUTH DAILY. (Patient not taking: Reported on 03/08/2017)  . [DISCONTINUED] sildenafil (REVATIO) 20 MG tablet TAKE THREE TABLETS BY MOUTH ONCE DAILY AS  NEEDED FOR  ED (Patient not taking: Reported on 03/08/2017)   No facility-administered encounter medications on file as of 03/08/2017.     Allergies: Amlodipine  Social History   Social History  . Marital status: Single    Spouse name: N/A  . Number of children: N/A  . Years of education: N/A   Occupational History  .  [employer]   Social History Main Topics  . Smoking status: Never Smoker  . Smokeless tobacco: Never Used  . Alcohol use Yes     Comment: occ  . Drug use: No  . Sexual activity: Not on file   Other Topics Concern  . Not on file   Social History Narrative  . No narrative on file    Family History  Problem Relation Age of Onset  . Lung disease Mother   . Hypertension Mother   . Other Father 48    agent  . Hypertension Father   . Coronary artery disease Cousin     multiple  . Coronary artery disease Other     aunts and uncles  . Stroke Sister 48  . Stroke Maternal Grandmother     Review of Systems: A 12-system review of systems was performed and was negative except as noted in the HPI.  --------------------------------------------------------------------------------------------------  Physical Exam: BP 122/80   Pulse 99   Ht 5\' 8"  (1.727 m)   Wt 190 lb (86.2 kg)   BMI 28.89 kg/m   General:  Well-developed, well-nourished man seated comfortably in the exam room. HEENT: No conjunctival pallor or scleral icterus.  Moist mucous membranes.  OP clear. Neck: Supple without lymphadenopathy, thyromegaly, JVD, or HJR. Lungs: Normal work of breathing.  Clear to auscultation bilaterally without wheezes or crackles. Heart: Regular rate and rhythm without murmurs, rubs, or gallops.  Non-displaced PMI. Abd: Bowel sounds present.  Soft, NT/ND without hepatosplenomegaly Ext: No lower extremity edema.  Radial, PT, and DP pulses are 2+ bilaterally. Skin: warm and dry without rash   Lab Results  Component Value Date   WBC 4.6 05/22/2014   HGB  12.5 (L) 05/22/2014   HCT 37.9 (L) 05/22/2014   MCV 81.3 05/22/2014   PLT 203 05/22/2014    Lab Results  Component Value Date   NA 141 05/18/2015   K 3.9 05/18/2015   CL 105 05/18/2015   CO2 28 05/18/2015   BUN 16 05/18/2015   CREATININE 1.14 05/18/2015   GLUCOSE 83 05/18/2015   ALT 32 05/21/2014    Lab Results  Component Value Date   CHOL 91 05/18/2015   HDL 39.30 05/18/2015   LDLCALC 41 05/18/2015   TRIG 52.0 05/18/2015   CHOLHDL 2 05/18/2015    --------------------------------------------------------------------------------------------------  ASSESSMENT AND PLAN: Coronary artery disease without angina Mr. Morawski is doing well without recurrence of chest pain. He remains very active. We will continue his current medication regimen for secondary prevention. He is not on a beta blocker due to significant fatigue with metoprolol and carvedilol.  Hypertension Blood pressure is upper normal today. He is currently on HCTZ alone, given his history of multiple medication intolerances in the past. We will not make any medication changes at this time and defer further blood pressure management to Dr. Harrington Challenger.  Hyperlipidemia Mr. Espin is tolerating high intensity statin therapy well. He should remain on this with  a target LDL less than 70.  Erectile dysfunction This has remained an intermittent problem for Mr. Hogeland despite de-escalation of his blood pressure regimen. I encouraged him to follow-up with his urologist.  Follow-up: Return to clinic in 1 year.  Nelva Bush, MD 03/09/2017 2:52 PM

## 2017-03-08 NOTE — Patient Instructions (Signed)
Medication Instructions:  Your physician recommends that you continue on your current medications as directed. Please refer to the Current Medication list given to you today.    Labwork: None   Testing/Procedures: None   Follow-Up: Your physician wants you to follow-up in: 1 year with Dr End. (April 2019). You will receive a reminder letter in the mail two months in advance. If you don't receive a letter, please call our office to schedule the follow-up appointment.        If you need a refill on your cardiac medications before your next appointment, please call your pharmacy.   

## 2017-03-22 ENCOUNTER — Telehealth: Payer: Self-pay | Admitting: Internal Medicine

## 2017-03-22 NOTE — Telephone Encounter (Signed)
Follow Up:    Returning your call from earlier this week.

## 2017-03-22 NOTE — Telephone Encounter (Signed)
Pt calling about a medication prescription ready at the pharmacy, not sure of the name of the medication. Pt will call pharmacy and get name of medication, will call me back if he has questions.

## 2017-04-03 ENCOUNTER — Other Ambulatory Visit: Payer: Self-pay | Admitting: Cardiology

## 2017-05-11 ENCOUNTER — Emergency Department (HOSPITAL_BASED_OUTPATIENT_CLINIC_OR_DEPARTMENT_OTHER): Payer: BLUE CROSS/BLUE SHIELD

## 2017-05-11 ENCOUNTER — Encounter (HOSPITAL_BASED_OUTPATIENT_CLINIC_OR_DEPARTMENT_OTHER): Payer: Self-pay | Admitting: Emergency Medicine

## 2017-05-11 ENCOUNTER — Emergency Department (HOSPITAL_BASED_OUTPATIENT_CLINIC_OR_DEPARTMENT_OTHER)
Admission: EM | Admit: 2017-05-11 | Discharge: 2017-05-11 | Disposition: A | Discharge status: Home / Self Care | Payer: BLUE CROSS/BLUE SHIELD | Attending: Emergency Medicine | Admitting: Emergency Medicine

## 2017-05-11 DIAGNOSIS — I2510 Atherosclerotic heart disease of native coronary artery without angina pectoris: Secondary | ICD-10-CM | POA: Insufficient documentation

## 2017-05-11 DIAGNOSIS — Z79899 Other long term (current) drug therapy: Secondary | ICD-10-CM | POA: Diagnosis not present

## 2017-05-11 DIAGNOSIS — D329 Benign neoplasm of meninges, unspecified: Secondary | ICD-10-CM | POA: Diagnosis not present

## 2017-05-11 DIAGNOSIS — R200 Anesthesia of skin: Secondary | ICD-10-CM | POA: Diagnosis present

## 2017-05-11 DIAGNOSIS — I10 Essential (primary) hypertension: Secondary | ICD-10-CM | POA: Diagnosis not present

## 2017-05-11 DIAGNOSIS — Z7982 Long term (current) use of aspirin: Secondary | ICD-10-CM | POA: Diagnosis not present

## 2017-05-11 LAB — CBC WITH DIFFERENTIAL/PLATELET
Basophils Absolute: 0 10*3/uL (ref 0.0–0.1)
Basophils Relative: 1 %
Eosinophils Absolute: 0.3 10*3/uL (ref 0.0–0.7)
Eosinophils Relative: 11 %
HEMATOCRIT: 41 % (ref 39.0–52.0)
HEMOGLOBIN: 14.3 g/dL (ref 13.0–17.0)
LYMPHS ABS: 0.8 10*3/uL (ref 0.7–4.0)
LYMPHS PCT: 30 %
MCH: 27.9 pg (ref 26.0–34.0)
MCHC: 34.9 g/dL (ref 30.0–36.0)
MCV: 79.9 fL (ref 78.0–100.0)
MONOS PCT: 18 %
Monocytes Absolute: 0.5 10*3/uL (ref 0.1–1.0)
NEUTROS PCT: 40 %
Neutro Abs: 1.1 10*3/uL — ABNORMAL LOW (ref 1.7–7.7)
Platelets: 289 10*3/uL (ref 150–400)
RBC: 5.13 MIL/uL (ref 4.22–5.81)
RDW: 14.3 % (ref 11.5–15.5)
WBC: 2.7 10*3/uL — ABNORMAL LOW (ref 4.0–10.5)

## 2017-05-11 LAB — COMPREHENSIVE METABOLIC PANEL
ALT: 28 U/L (ref 17–63)
AST: 31 U/L (ref 15–41)
Albumin: 3.5 g/dL (ref 3.5–5.0)
Alkaline Phosphatase: 136 U/L — ABNORMAL HIGH (ref 38–126)
Anion gap: 7 (ref 5–15)
BUN: 13 mg/dL (ref 6–20)
CHLORIDE: 103 mmol/L (ref 101–111)
CO2: 27 mmol/L (ref 22–32)
Calcium: 8.8 mg/dL — ABNORMAL LOW (ref 8.9–10.3)
Creatinine, Ser: 1.09 mg/dL (ref 0.61–1.24)
Glucose, Bld: 113 mg/dL — ABNORMAL HIGH (ref 65–99)
POTASSIUM: 3.4 mmol/L — AB (ref 3.5–5.1)
SODIUM: 137 mmol/L (ref 135–145)
Total Bilirubin: 1.3 mg/dL — ABNORMAL HIGH (ref 0.3–1.2)
Total Protein: 7.6 g/dL (ref 6.5–8.1)

## 2017-05-11 MED ORDER — METHYLPREDNISOLONE 4 MG PO TBPK: ORAL_TABLET | ORAL | 0 refills | Status: DC

## 2017-05-11 MED ORDER — DIPHENHYDRAMINE HCL 50 MG/ML IJ SOLN
25.0000 mg | Freq: Once | INTRAMUSCULAR | Status: AC
  Administered 2017-05-11: 25 mg via INTRAVENOUS
  Filled 2017-05-11: qty 1

## 2017-05-11 MED ORDER — GADOBENATE DIMEGLUMINE 529 MG/ML IV SOLN
15.0000 mL | Freq: Once | INTRAVENOUS | Status: AC | PRN
  Administered 2017-05-11: 14 mL via INTRAVENOUS

## 2017-05-11 MED ORDER — PROCHLORPERAZINE EDISYLATE 5 MG/ML IJ SOLN
10.0000 mg | Freq: Once | INTRAMUSCULAR | Status: AC
  Administered 2017-05-11: 10 mg via INTRAVENOUS
  Filled 2017-05-11: qty 2

## 2017-05-11 NOTE — ED Notes (Signed)
Pt remains in MRI 

## 2017-05-11 NOTE — ED Triage Notes (Addendum)
Pt reports numbness to R side of face since 1130 this morning, HA,  intermittent dizziness. Pt states his doctor has had difficulty keeping his BP under control. Stroke screen negative.

## 2017-05-11 NOTE — ED Provider Notes (Signed)
[facility] Provider Note   CSN: [id number] Arrival date & time: 05/11/17  1257     History   Chief Complaint Chief Complaint  Patient presents with  . Numbness    HPI NASZIR COTT is a 56 y.o. male.  HPI   Today felt tingling in right side of cheek, occurred at 11:30AM, reports it was present on arrival to the ED. Reports felt tingling but when went to touch face it felt numb.  No other tingling or numbness. No weakness, no facial droop, no difficulty walking or talking, no change in vision.  Headache for a few weeks, pressure, 3/10. Comes and goes.  No head trauma.  Working on blood pressure with Dr. Harrington Challenger, was having side effects so stopped some medications, on hctz and feels like needs to be on more.  When blood pressure is normal feels better, no headache.  No chest pain or dyspnea.   Past Medical History:  Diagnosis Date  . CAD (coronary artery disease)    a. NSTEMI 01/2014 s/p DES-100% distal LCx  . GERD (gastroesophageal reflux disease)   . Hyperlipidemia   . Hypertension   . Hypokalemia   . NSTEMI (non-ST elevated myocardial infarction) [facility])     Patient Active Problem List   Diagnosis Date Noted  . Erectile dysfunction 12/25/2016  . Precordial chest pain 05/22/2014  . Coronary artery disease involving native coronary artery of native heart without angina pectoris 01/30/2014  . Hypokalemia 01/30/2014  . Hyperlipidemia LDL goal <70 01/30/2014  . Fever 01/30/2014  . History of non-ST elevation myocardial infarction (NSTEMI) 01/28/2014  . Essential hypertension 01/28/2014    Past Surgical History:  Procedure Laterality Date  . KNEE SURGERY Right    reconstructive  . LEFT HEART CATHETERIZATION WITH CORONARY ANGIOGRAM N/A 01/28/2014   Procedure: LEFT HEART CATHETERIZATION WITH CORONARY ANGIOGRAM;  Surgeon: Larey Dresser, MD;  Location: [facility] CATH LAB;  Service: Cardiovascular;  Laterality: N/A;  . PERCUTANEOUS CORONARY STENT INTERVENTION (PCI-S)      40% ostial ramus, 100% distal LCx s/p DES, minor luminal irregularities elsewhere; 60-65%, there was basal inferior severe hypokinesis  . PERCUTANEOUS STENT INTERVENTION  01/28/2014   Procedure: PERCUTANEOUS STENT INTERVENTION;  Surgeon: Larey Dresser, MD;  Location: [facility] CATH LAB;  Service: Cardiovascular;;       Home Medications    Prior to Admission medications   Medication Sig Start Date End Date Taking? Authorizing Provider  aspirin EC 81 MG EC tablet Take 1 tablet (81 mg total) by mouth daily. 01/30/14  Yes Arguello, Roger A, PA-C  atorvastatin (LIPITOR) 80 MG tablet TAKE 1 TABLET (80 MG TOTAL) BY MOUTH DAILY AT 6 PM. 04/03/17  Yes End, Harrell Gave, MD  hydrochlorothiazide (HYDRODIURIL) 25 MG tablet Take 1 tablet (25 mg total) by mouth daily. 09/24/16   Larey Dresser, MD  potassium chloride SA (K-DUR,KLOR-CON) 20 MEQ tablet Take 20 mEq by mouth every evening.    [provider]  tamsulosin (FLOMAX) 0.4 MG CAPS capsule Take 0.4 mg by mouth every evening.    [provider]    Family History Family History  Problem Relation Age of Onset  . Lung disease Mother   . Hypertension Mother   . Other Father 75       agent  . Hypertension Father   . Coronary artery disease Cousin        multiple  . Coronary artery disease Other        aunts and uncles  .  Stroke Sister 90  . Stroke Maternal Grandmother     Social History Social History  Substance Use Topics  . Smoking status: Never Smoker  . Smokeless tobacco: Never Used  . Alcohol use Yes     Comment: occ     Allergies   Amlodipine   Review of Systems Review of Systems  Constitutional: Positive for diaphoresis (at night) and unexpected weight change (new job is more physical, lost 10-15lbover last 3-4 mos). Negative for fever.  HENT: Negative for congestion and sore throat.   Eyes: Negative for visual disturbance.  Respiratory: Negative for shortness of breath. Cough: last week.   Cardiovascular:  Negative for chest pain.  Gastrointestinal: Negative for abdominal pain, diarrhea, nausea and vomiting.  Genitourinary: Negative for difficulty urinating.  Musculoskeletal: Negative for back pain and neck stiffness.  Skin: Negative for rash.  Neurological: Positive for light-headedness, numbness and headaches. Negative for syncope, facial asymmetry, speech difficulty and weakness.     Physical Exam Updated Vital Signs BP (!) 147/94   Pulse 75   Temp 98.4 F (36.9 C) (Oral)   Resp 18   Ht 5\' 8"  (1.727 m)   Wt 88.5 kg (195 lb)   SpO2 96%   BMI 29.65 kg/m   Physical Exam  Constitutional: He is oriented to person, place, and time. He appears well-developed and well-nourished. No distress.  HENT:  Head: Normocephalic and atraumatic.  Eyes: Conjunctivae and EOM are normal.  Neck: Normal range of motion.  Cardiovascular: Normal rate, regular rhythm, normal heart sounds and intact distal pulses.  Exam reveals no gallop and no friction rub.   No murmur heard. Pulmonary/Chest: Effort normal and breath sounds normal. No respiratory distress. He has no wheezes. He has no rales.  Abdominal: Soft. He exhibits no distension. There is no tenderness. There is no guarding.  Musculoskeletal: He exhibits no edema.  Neurological: He is alert and oriented to person, place, and time. He has normal strength. No cranial nerve deficit or sensory deficit. Coordination and gait normal. GCS eye subscore is 4. GCS verbal subscore is 5. GCS motor subscore is 6.  Skin: Skin is warm and dry. He is not diaphoretic.  Nursing note and vitals reviewed.    ED Treatments / Results  Labs (all labs ordered are listed, but only abnormal results are displayed) Labs Reviewed  CBC WITH DIFFERENTIAL/PLATELET - Abnormal; Notable for the following:       Result Value   WBC 2.7 (*)    Neutro Abs 1.1 (*)    All other components within normal limits  COMPREHENSIVE METABOLIC PANEL - Abnormal; Notable for the following:     Potassium 3.4 (*)    Glucose, Bld 113 (*)    Calcium 8.8 (*)    Alkaline Phosphatase 136 (*)    Total Bilirubin 1.3 (*)    All other components within normal limits    EKG  EKG Interpretation None       Radiology Ct Head Wo Contrast  Result Date: 05/11/2017 CLINICAL DATA:  RIGHT-sided facial numbness and headache. EXAM: CT HEAD WITHOUT CONTRAST TECHNIQUE: Contiguous axial images were obtained from the base of the skull through the vertex without intravenous contrast. COMPARISON:  None. FINDINGS: Brain: No acute intracranial hemorrhage. No focal mass lesion. No CT evidence of acute infarction. No midline shift or mass effect. No hydrocephalus. Basilar cisterns are patent. Vascular: No hyperdense vessel or unexpected calcification. Skull: Normal. Negative for fracture or focal lesion. Sinuses/Orbits:  Post inflammation in the  maxillary sinuses. Other: None. IMPRESSION: Normal head CT. Sinus inflammation. Electronically Signed   By: Suzy Bouchard M.D.   On: 05/11/2017 14:16    Procedures Procedures (including critical care time)  Medications Ordered in ED Medications  prochlorperazine (COMPAZINE) injection 10 mg (not administered)  diphenhydrAMINE (BENADRYL) injection 25 mg (not administered)     Initial Impression / Assessment and Plan / ED Course  I have reviewed the triage vital signs and the nursing notes.  Pertinent labs & imaging results that were available during my care of the patient were reviewed by me and considered in my medical decision making (see chart for details).     56 year old male with a history of coronary artery disease, hypertension, hyperlipidemia presents with concern for episode of right sided facial tingling and numbness, episodes of headache.  Patient with normal neurologic exam at time of my evaluation. Discussed with Dr. Leonel Ramsay of neurology, and we feel that given patient with positive symptoms of tingling in face, without other neurologic  abnormalities, overall have low suspicion that this represents TIA, and feel outpatient evaluation would be appropriate if MRI shows no sign of CVA or significant vessel stenosis.  Patient does wrote report a headache with some photophobia, and while this is mild, may represent migraine. We'll give headache cocktail and reassess symptoms.  Patient is concerned regarding his blood pressures running in the 150s. Recommend close outpatient follow-up regarding this mild elevation.  MRI pending at time of transfer to Dr. Alvino Chapel.  If negative, pt will be discharged with outpatient follow up.   Final Clinical Impressions(s) / ED Diagnoses   Final diagnoses:  Facial numbness    New Prescriptions New Prescriptions   No medications on file     Gareth Morgan, MD 05/11/17 1655

## 2017-05-11 NOTE — ED Notes (Addendum)
Pt given water to drink.  Pt on automatic VS.

## 2017-05-11 NOTE — ED Notes (Signed)
PT reports feeling nausea for a couple weeks, tingling in right cheek, and "feels bad' thinks his BP is elevated. PT reports he has been taking HCTZ and was taken off all other BP meds about 4 months ago due to side effects from BP meds.

## 2017-05-11 NOTE — ED Notes (Signed)
ED Provider at bedside. 

## 2017-05-11 NOTE — ED Notes (Signed)
Patient transported to MRI 

## 2017-05-11 NOTE — ED Provider Notes (Signed)
  Physical Exam  BP (!) 158/85 (BP Location: Right Arm)   Pulse 72   Temp 98.4 F (36.9 C) (Oral)   Resp 18   Ht 5\' 8"  (1.727 m)   Wt 88.5 kg (195 lb)   SpO2 98%   BMI 29.65 kg/m   Physical Exam  ED Course  Procedures  MDM Patient with facial numbness. MRI shows likely meningioma. Discussed with Dr. Saintclair Halsted through neurosurgery. We'll give steroids and have follow-up with Dr. Salomon Fick at [facility]. Will follow with Dr. Saintclair Halsted if he cannot get in soon to see neurosurgery at [facility], MD 05/11/17 [phone]

## 2017-05-11 NOTE — ED Notes (Signed)
Pt continues to be on cardiac monitor and auto VS

## 2017-06-04 ENCOUNTER — Encounter (HOSPITAL_BASED_OUTPATIENT_CLINIC_OR_DEPARTMENT_OTHER): Payer: Self-pay | Admitting: *Deleted

## 2017-06-04 ENCOUNTER — Emergency Department (HOSPITAL_BASED_OUTPATIENT_CLINIC_OR_DEPARTMENT_OTHER)
Admission: EM | Admit: 2017-06-04 | Discharge: 2017-06-04 | Disposition: A | Discharge status: Home / Self Care | Payer: BLUE CROSS/BLUE SHIELD | Attending: Emergency Medicine | Admitting: Emergency Medicine

## 2017-06-04 DIAGNOSIS — Z7982 Long term (current) use of aspirin: Secondary | ICD-10-CM | POA: Diagnosis not present

## 2017-06-04 DIAGNOSIS — Z79899 Other long term (current) drug therapy: Secondary | ICD-10-CM | POA: Insufficient documentation

## 2017-06-04 DIAGNOSIS — Z5189 Encounter for other specified aftercare: Secondary | ICD-10-CM

## 2017-06-04 DIAGNOSIS — Y33XXXD Other specified events, undetermined intent, subsequent encounter: Secondary | ICD-10-CM | POA: Insufficient documentation

## 2017-06-04 DIAGNOSIS — S0101XD Laceration without foreign body of scalp, subsequent encounter: Secondary | ICD-10-CM | POA: Diagnosis present

## 2017-06-04 DIAGNOSIS — I10 Essential (primary) hypertension: Secondary | ICD-10-CM | POA: Diagnosis not present

## 2017-06-04 DIAGNOSIS — I2510 Atherosclerotic heart disease of native coronary artery without angina pectoris: Secondary | ICD-10-CM | POA: Insufficient documentation

## 2017-06-04 NOTE — ED Provider Notes (Signed)
[facility] DE[facility] Provider Note   CSN: [id number] Arrival date & time: 06/04/17  2136  By signing my name below, I, Bryan Torres, attest that this documentation has been prepared under the direction and in the presence of Dejour Vos, PA-C. Electronically Signed: Dora Torres, Scribe. 06/04/2017. 10:32 PM.  History   Chief Complaint Chief Complaint  Patient presents with  . Wound Check   The history is provided by the patient. No language interpreter was used.    HPI Comments: Bryan Torres is a 56 y.o. male with PMHx including CAD, HTN, and NSTEMI who presents to the Emergency Department for a wound check. He had a scalp biopsy on 05/24/17 and had the sutures removed today at [facility]. Patient took a shower after the sutures were removed and states the wound began to "open up". Patient is scheduled to return to work tomorrow and is concerned the wound may become infected. 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 Patient denies any pain at the wound site, spontaneous drainage, fevers, chills, or any other associated symptoms. Patient states that when the stitches were removed today, both the person remove the stitches and his doctor to look at the wound and were happy with its healing. Patient is concerned that he will have poor cosmetic results.  Past Medical History:  Diagnosis Date  . CAD (coronary artery disease)    a. NSTEMI 01/2014 s/p DES-100% distal LCx  . GERD (gastroesophageal reflux disease)   . Hyperlipidemia   . Hypertension   . Hypokalemia   . NSTEMI (non-ST elevated myocardial infarction) [facility])     Patient Active Problem List   Diagnosis Date Noted  . Erectile dysfunction 12/25/2016  . Precordial chest pain 05/22/2014  . Coronary artery disease involving native coronary artery of native heart without angina pectoris 01/30/2014  . Hypokalemia 01/30/2014  . Hyperlipidemia LDL goal <70 01/30/2014  . Fever 01/30/2014  . History of non-ST  elevation myocardial infarction (NSTEMI) 01/28/2014  . Essential hypertension 01/28/2014    Past Surgical History:  Procedure Laterality Date  . KNEE SURGERY Right    reconstructive  . LEFT HEART CATHETERIZATION WITH CORONARY ANGIOGRAM N/A 01/28/2014   Procedure: LEFT HEART CATHETERIZATION WITH CORONARY ANGIOGRAM;  Surgeon: Larey Dresser, MD;  Location: [facility] CATH LAB;  Service: Cardiovascular;  Laterality: N/A;  . PERCUTANEOUS CORONARY STENT INTERVENTION (PCI-S)     40% ostial ramus, 100% distal LCx s/p DES, minor luminal irregularities elsewhere; 60-65%, there was basal inferior severe hypokinesis  . PERCUTANEOUS STENT INTERVENTION  01/28/2014   Procedure: PERCUTANEOUS STENT INTERVENTION;  Surgeon: Larey Dresser, MD;  Location: [facility] CATH LAB;  Service: Cardiovascular;;       Home Medications    Prior to Admission medications   Medication Sig Start Date End Date Taking? Authorizing Provider  aspirin EC 81 MG EC tablet Take 1 tablet (81 mg total) by mouth daily. 01/30/14   Arguello, Roger A, PA-C  atorvastatin (LIPITOR) 80 MG tablet TAKE 1 TABLET (80 MG TOTAL) BY MOUTH DAILY AT 6 PM. 04/03/17   End, Harrell Gave, MD  hydrochlorothiazide (HYDRODIURIL) 25 MG tablet Take 1 tablet (25 mg total) by mouth daily. 09/24/16   Larey Dresser, MD  methylPREDNISolone (MEDROL DOSEPAK) 4 MG TBPK tablet [place] as directed 05/11/17   Davonna Belling, MD  potassium chloride SA (K-DUR,KLOR-CON) 20 MEQ tablet Take 20 mEq by mouth every evening.    [provider]  tamsulosin (FLOMAX) 0.4 MG CAPS capsule Take 0.4 mg by  mouth every evening.    [provider]    Family History Family History  Problem Relation Age of Onset  . Lung disease Mother   . Hypertension Mother   . Other Father 41       agent  . Hypertension Father   . Coronary artery disease Cousin        multiple  . Coronary artery disease Other        aunts and uncles  . Stroke Sister 48  . Stroke Maternal Grandmother      Social History Social History  Substance Use Topics  . Smoking status: Never Smoker  . Smokeless tobacco: Never Used  . Alcohol use Yes     Comment: occ     Allergies   Amlodipine   Review of Systems Review of Systems  Constitutional: Negative for chills and fever.  Skin: Positive for wound.   Physical Exam Updated Vital Signs BP (!) 159/101   Pulse 69   Resp 20   Ht 5\' 8"  (1.727 m)   Wt 88.5 kg (195 lb)   SpO2 97%   BMI 29.65 kg/m   Physical Exam  Constitutional: He is oriented to person, place, and time. He appears well-developed and well-nourished. No distress.  HENT:  Head: Normocephalic.    1.5 cm laceration to the right temple just anterior to the ear. Laceration is about 2 mm deep, is well aligned, and without drainage or bleeding at this time. No signs of surrounding erythema. No gaping or change of the wound when patient turns his head or speaks.  Eyes: Conjunctivae and EOM are normal.  Neck: Normal range of motion. No tracheal deviation present.  Cardiovascular: Normal rate and regular rhythm.   Pulmonary/Chest: Effort normal and breath sounds normal. No respiratory distress.  Musculoskeletal: Normal range of motion.  Neurological: He is alert and oriented to person, place, and time.  Skin: Skin is warm and dry.  Psychiatric: He has a normal mood and affect. His behavior is normal.  Nursing note and vitals reviewed.  ED Treatments / Results  Labs (all labs ordered are listed, but only abnormal results are displayed) Labs Reviewed - No data to display  EKG  EKG Interpretation None       Radiology No results found.  Procedures .Marland KitchenLaceration Repair Date/Time: 06/04/2017 11:02 PM Performed by: Franchot Heidelberg Authorized by: Franchot Heidelberg   Consent:    Consent obtained:  Verbal   Consent given by:  Patient   Risks discussed:  Infection, pain and poor cosmetic result   Alternatives discussed:  No treatment Anesthesia (see MAR for  exact dosages):    Anesthesia method:  None Laceration details:    Location:  Scalp   Scalp location:  R temporal   Length (cm):  1.5   Depth (mm):  2 Repair type:    Repair type:  Simple Treatment:    Area cleansed with:  Hibiclens Skin repair:    Repair method:  Steri-Strips   Number of Steri-Strips:  3 Approximation:    Approximation:  Close   Vermilion border: well-aligned   Post-procedure details:    Dressing:  Open (no dressing)   Patient tolerance of procedure:  Tolerated well, no immediate complications   (including critical care time)  DIAGNOSTIC STUDIES: Oxygen Saturation is 97% on RA, normal by my interpretation.    COORDINATION OF CARE: 10:28 PM Discussed treatment plan with pt at bedside and pt agreed to plan.  Medications Ordered in ED Medications -  No data to display   Initial Impression / Assessment and Plan / ED Course  I have reviewed the triage vital signs and the nursing notes.  Pertinent labs & imaging results that were available during my care of the patient were reviewed by me and considered in my medical decision making (see chart for details).     Patient presenting today after stitches removal with concern for poorly healed wound. Patient states that the doctor was happy with the wound healing when the stitches were removed earlier today. He went home and showered, and when he looked in the mirror he thought he saw the wound was gaping while he turned his head. He is very concerned about things getting in it while he is at work, and poor cosmetic results. Discussed with patient that his wound will heal without further treatment. Patient states he understands, but he wants the scar to be pretty and minimal. Location of laceration, while temple, will allow for Steri-Strips. Discussed option of using Steri-Strips to help with wound healing, and patient is agreeable to this plan. Patient to continue to follow-up with [facility] as scheduled. Discussed wound  care and use of Steri-Strips. Return precautions given. Patient states he understands and agrees to plan.  Final Clinical Impressions(s) / ED Diagnoses   Final diagnoses:  Visit for wound check    New Prescriptions New Prescriptions   No medications on file   I personally performed the services described in this documentation, which was scribed in my presence. The recorded information has been reviewed and is accurate.    Franchot Heidelberg, PA-C 06/04/17 2312    Sherwood Gambler, MD 06/05/17 Quentin Mulling

## 2017-06-04 NOTE — Discharge Instructions (Signed)
Leave the Steri-Strips on until they fall off on their own. Do not pull them off. They may get wet, but do not scrub vigorously at that site. Keep your scheduled appointments with [facility]. Return to the emergency department if you develop fever, chills, headaches, vision changes, or any new or worsening symptoms.

## 2017-06-04 NOTE — ED Triage Notes (Signed)
He had sutures taken out of a laceration to the right side of his scalp today. After taken a shower the wound opened.

## 2017-08-03 ENCOUNTER — Other Ambulatory Visit: Payer: Self-pay | Admitting: Cardiology

## 2017-08-26 ENCOUNTER — Telehealth: Payer: Self-pay | Admitting: Internal Medicine

## 2017-08-26 NOTE — Telephone Encounter (Signed)
New message    Patient wife Celestine calling  Wants to get a new   order for patient to get echo. Wants echo done here at [facility]. Patient already has order from [facility].  Advised wife in order to schedule here we need the order faxed.

## 2017-08-26 NOTE — Telephone Encounter (Signed)
Pt's wife states pt has been diagnosed with sarcoidosis and his  rheumatologist has ordered an echocardiogram. Pt would like to get echocardiogram done here. Pt's wife advised I will forward to [place], echo scheduler, to follow-up with her about making arrangements to get echocardiogram done here.

## 2017-10-24 DIAGNOSIS — R3912 Poor urinary stream: Secondary | ICD-10-CM | POA: Diagnosis not present

## 2017-10-24 DIAGNOSIS — R972 Elevated prostate specific antigen [PSA]: Secondary | ICD-10-CM | POA: Diagnosis not present

## 2017-10-24 DIAGNOSIS — N5201 Erectile dysfunction due to arterial insufficiency: Secondary | ICD-10-CM | POA: Diagnosis not present

## 2017-11-22 DIAGNOSIS — R3912 Poor urinary stream: Secondary | ICD-10-CM | POA: Diagnosis not present

## 2017-11-22 DIAGNOSIS — N5201 Erectile dysfunction due to arterial insufficiency: Secondary | ICD-10-CM | POA: Diagnosis not present

## 2017-11-22 DIAGNOSIS — R972 Elevated prostate specific antigen [PSA]: Secondary | ICD-10-CM | POA: Diagnosis not present

## 2018-01-03 DIAGNOSIS — J069 Acute upper respiratory infection, unspecified: Secondary | ICD-10-CM | POA: Diagnosis not present

## 2018-02-01 DIAGNOSIS — M1711 Unilateral primary osteoarthritis, right knee: Secondary | ICD-10-CM | POA: Diagnosis not present

## 2018-03-07 ENCOUNTER — Other Ambulatory Visit: Payer: Self-pay | Admitting: Cardiology

## 2018-03-10 DIAGNOSIS — Z09 Encounter for follow-up examination after completed treatment for conditions other than malignant neoplasm: Secondary | ICD-10-CM | POA: Diagnosis not present

## 2018-03-10 DIAGNOSIS — D8689 Sarcoidosis of other sites: Secondary | ICD-10-CM | POA: Diagnosis not present

## 2018-03-10 DIAGNOSIS — Z8669 Personal history of other diseases of the nervous system and sense organs: Secondary | ICD-10-CM | POA: Diagnosis not present

## 2018-03-11 ENCOUNTER — Other Ambulatory Visit: Payer: Self-pay

## 2018-04-10 ENCOUNTER — Other Ambulatory Visit: Payer: Self-pay | Admitting: Cardiology

## 2018-04-28 ENCOUNTER — Other Ambulatory Visit: Payer: Self-pay | Admitting: Internal Medicine

## 2018-04-28 DIAGNOSIS — I10 Essential (primary) hypertension: Secondary | ICD-10-CM

## 2018-04-29 NOTE — Telephone Encounter (Signed)
Refill Request.  

## 2018-04-30 ENCOUNTER — Other Ambulatory Visit: Payer: Self-pay | Admitting: Cardiology

## 2018-04-30 ENCOUNTER — Telehealth: Payer: Self-pay | Admitting: Internal Medicine

## 2018-04-30 NOTE — Telephone Encounter (Signed)
Can I refill patient's Sildenafil medication?  Thank you

## 2018-04-30 NOTE — Telephone Encounter (Signed)
[place] pt.

## 2018-04-30 NOTE — Telephone Encounter (Signed)
Follow up    Patient is calling for status of Sildenafil.

## 2018-04-30 NOTE — Telephone Encounter (Signed)
Pt's pharmacy is requesting a refill on Sildenafil 20 mg tablet. This medication was D/C, but not by Dr. Saunders Revel. I do not see where Dr. Saunders Revel D/C this medication. Would Dr. Saunders Revel like to refill this medication? Please address

## 2018-04-30 NOTE — Telephone Encounter (Signed)
Matter has already been address. Thank you

## 2018-05-01 ENCOUNTER — Other Ambulatory Visit: Payer: Self-pay

## 2018-05-01 DIAGNOSIS — M791 Myalgia, unspecified site: Secondary | ICD-10-CM | POA: Diagnosis not present

## 2018-05-01 DIAGNOSIS — R9389 Abnormal findings on diagnostic imaging of other specified body structures: Secondary | ICD-10-CM | POA: Diagnosis not present

## 2018-05-01 DIAGNOSIS — D8689 Sarcoidosis of other sites: Secondary | ICD-10-CM | POA: Diagnosis not present

## 2018-05-01 NOTE — Telephone Encounter (Signed)
Follow Up:    Pt checking on the status of his Sidenafil refill.

## 2018-05-02 NOTE — Telephone Encounter (Signed)
RX sent for sildenafil. Patient aware.

## 2018-05-09 NOTE — Telephone Encounter (Signed)
Disregard opened in error °

## 2018-05-30 ENCOUNTER — Telehealth: Payer: Self-pay | Admitting: *Deleted

## 2018-05-30 NOTE — Telephone Encounter (Signed)
   Primary Cardiologist:Bryan End, MD  Chart reviewed as part of pre-operative protocol coverage. Because of Bryan Torres's past medical history and time since last visit, he/she will require a follow-up visit in order to better assess preoperative cardiovascular risk.  Pre-op covering staff: - Pt has an upcoming appt with Vin on 07/10/18, unless we can get him in sooner. - Please contact requesting surgeon's office via preferred method (i.e, phone, fax) to inform them that clearance will be addressed at that time.   Daune Perch, NP  05/30/2018, 2:24 PM

## 2018-05-30 NOTE — Telephone Encounter (Signed)
   [facility] Pre-operative Risk Assessment    Request for surgical clearance:  1. What type of surgery is being performed?   RIGHT TOTAL KNEE REPLACEMENT   2. When is this surgery scheduled?  07/25/18  3. What type of clearance is required (medical clearance vs. Pharmacy clearance to hold med vs. Both)? MEDICAL  4. Are there any medications that need to be held prior to surgery and how long?  ASA  5. Practice name and name of physician performing surgery?  [facility]   6. What is your office phone number [phone]   7.   What is your office fax number  [phone]  8.   Anesthesia type (None, local, MAC, general) ?  SPINAL   Devra Dopp 05/30/2018, 9:59 AM  _________________________________________________________________   (provider comments below)

## 2018-06-17 DIAGNOSIS — M1712 Unilateral primary osteoarthritis, left knee: Secondary | ICD-10-CM | POA: Diagnosis not present

## 2018-06-19 ENCOUNTER — Encounter: Payer: Self-pay | Admitting: Physician Assistant

## 2018-06-19 ENCOUNTER — Ambulatory Visit: Payer: BLUE CROSS/BLUE SHIELD | Admitting: Physician Assistant

## 2018-06-19 VITALS — BP 136/92 | HR 68 | Ht 68.0 in | Wt 198.4 lb

## 2018-06-19 DIAGNOSIS — E785 Hyperlipidemia, unspecified: Secondary | ICD-10-CM | POA: Diagnosis not present

## 2018-06-19 DIAGNOSIS — I10 Essential (primary) hypertension: Secondary | ICD-10-CM | POA: Diagnosis not present

## 2018-06-19 DIAGNOSIS — Z01818 Encounter for other preprocedural examination: Secondary | ICD-10-CM | POA: Diagnosis not present

## 2018-06-19 DIAGNOSIS — I2510 Atherosclerotic heart disease of native coronary artery without angina pectoris: Secondary | ICD-10-CM

## 2018-06-19 NOTE — Progress Notes (Addendum)
Cardiology Office Note    Date:  06/19/2018   ID:  JOSECARLOS Torres, DOB Jan 22, 1961, MRN [id number]  PCP:  Bryan Cruel, MD  Cardiologist: Dr. Saunders Torres  Chief Complaint: Yearly follow up and surgical clearance for right total knee replacement   History of Present Illness:   Bryan Torres is a 57 y.o. male with hx of CAD, HTN and HLD presents for follow up.   Hx of CAD s/p NSTEMI in 01/2014 s/p DES to distal LCx. History of multiple medication intolerances in the past for high blood pressure ( managed by PCP).  He was doing well on cardiac stand point when last seen by Dr. Saunders Torres 02/2017.  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   Here today for follow up. He used to play tennis 4 times/week however for the past 3 months he has reduced to 1 times/week due to knee pain. No limiting cardiac symptoms. Each session is 1 hour long. The patient denies nausea, vomiting, fever, chest pain, palpitations, shortness of breath, orthopnea, PND, dizziness, syncope, cough, congestion, abdominal pain, hematochezia, melena, lower extremity edema.  Past Medical History:  Diagnosis Date  . CAD (coronary artery disease)    a. NSTEMI 01/2014 s/p DES-100% distal LCx  . GERD (gastroesophageal reflux disease)   . Hyperlipidemia   . Hypertension   . Hypokalemia   . NSTEMI (non-ST elevated myocardial infarction) ([place])   . Sarcoidosis of central nervous system     Past Surgical History:  Procedure Laterality Date  . KNEE SURGERY Right    reconstructive  . LEFT HEART CATHETERIZATION WITH CORONARY ANGIOGRAM N/A 01/28/2014   Procedure: LEFT HEART CATHETERIZATION WITH CORONARY ANGIOGRAM;  Surgeon: Bryan Dresser, MD;  Location: [facility] CATH LAB;  Service: Cardiovascular;  Laterality: N/A;  . PERCUTANEOUS CORONARY STENT INTERVENTION (PCI-S)     40% ostial ramus, 100% distal LCx s/p DES, minor luminal irregularities elsewhere; 60-65%, there was basal inferior  severe hypokinesis  . PERCUTANEOUS STENT INTERVENTION  01/28/2014   Procedure: PERCUTANEOUS STENT INTERVENTION;  Surgeon: Bryan Dresser, MD;  Location: [facility] CATH LAB;  Service: Cardiovascular;;    Current Medications: Prior to Admission medications   Medication Sig Start Date End Date Taking? Authorizing Provider  aspirin EC 81 MG EC tablet Take 1 tablet (81 mg total) by mouth daily. 01/30/14   Arguello, Roger A, PA-C  atorvastatin (LIPITOR) 80 MG tablet Take 1 tablet (80 mg total) by mouth daily at 6 PM. Please call and schedule an appt for further refills, 1st attempt 04/10/18   Bryan Dresser, MD  hydrochlorothiazide (HYDRODIURIL) 25 MG tablet Take 1 tablet (25 mg total) by mouth daily. 09/24/16   Bryan Dresser, MD  methylPREDNISolone (MEDROL DOSEPAK) 4 MG TBPK tablet [place] as directed 05/11/17   Bryan Belling, MD  potassium chloride SA (K-DUR,KLOR-CON) 20 MEQ tablet Take 20 mEq by mouth every evening.    [provider]  potassium chloride SA (KLOR-CON M20) 20 MEQ tablet Take 1 tablet (20 mEq total) by mouth daily. Please call to schedule annual appointment for additional refills thanks. 03/11/18   End, Bryan Gave, MD  sildenafil (REVATIO) 20 MG tablet TAKE THREE TABLETS BY MOUTH ONCE DAILY AS NEEDED FOR  ERECTILE  DYSFUNCTION 05/02/18   End, Bryan Gave, MD  tamsulosin (FLOMAX) 0.4 MG CAPS capsule Take 0.4 mg by mouth every evening.    [provider]    Allergies:   Amlodipine   Social History  Socioeconomic History  . Marital status: Single    Spouse name: Not on file  . Number of children: Not on file  . Years of education: Not on file  . Highest education level: Not on file  Occupational History    Employer: [employer]  Social Needs  . Financial resource strain: Not on file  . Food insecurity:    Worry: Not on file    Inability: Not on file  . Transportation needs:    Medical: Not on file    Non-medical: Not on file  Tobacco Use  . Smoking  status: Never Smoker  . Smokeless tobacco: Never Used  Substance and Sexual Activity  . Alcohol use: Yes    Comment: occ  . Drug use: No  . Sexual activity: Not on file  Lifestyle  . Physical activity:    Days per week: Not on file    Minutes per session: Not on file  . Stress: Not on file  Relationships  . Social connections:    Talks on phone: Not on file    Gets together: Not on file    Attends religious service: Not on file    Active member of club or organization: Not on file    Attends meetings of clubs or organizations: Not on file    Relationship status: Not on file  Other Topics Concern  . Not on file  Social History Narrative  . Not on file     Family History:  The patient's family history includes Coronary artery disease in his cousin and other; Hypertension in his father and mother; Lung disease in his mother; Other (age of onset: 15) in his father; Stroke in his maternal grandmother; Stroke (age of onset: 53) in his sister.   ROS:   Please see the history of present illness.    ROS All other systems reviewed and are negative.   PHYSICAL EXAM:   VS:  BP (!) 136/92   Pulse 68   Ht 5\' 8"  (1.727 m)   Wt 198 lb 6.4 oz (90 kg)   SpO2 92%   BMI 30.17 kg/m    GEN: Well nourished, well developed, in no acute distress  HEENT: normal  Neck: no JVD, carotid bruits, or masses Cardiac: RRR; no murmurs, rubs, or gallops,no edema  Respiratory:  clear to auscultation bilaterally, normal work of breathing GI: soft, nontender, nondistended, + BS MS: no deformity or atrophy  Skin: warm and dry, no rash Neuro:  Alert and Oriented x 3, Strength and sensation are intact Psych: euthymic mood, full affect  Wt Readings from Last 3 Encounters:  06/19/18 198 lb 6.4 oz (90 kg)  06/04/17 195 lb (88.5 kg)  05/11/17 195 lb (88.5 kg)      Studies/Labs Reviewed:   EKG:  EKG is ordered today.  The ekg ordered today demonstrates NSR at rate of 68 bpm. Non specific TWI in  inferior lateral leads improved compared to last EKG.   Recent Labs: No results found for requested labs within last 8760 hours.   Lipid Panel    Component Value Date/Time   CHOL 91 05/18/2015 1348   TRIG 52.0 05/18/2015 1348   HDL 39.30 05/18/2015 1348   CHOLHDL 2 05/18/2015 1348   VLDL 10.4 05/18/2015 1348   LDLCALC 41 05/18/2015 1348    Additional studies/ records that were reviewed today include:   Echocardiogram: 02/2014 Left ventricle: The cavity size was normal. There was mild concentric hypertrophy. Systolic function  was normal. The estimated ejection fraction was in the range of 60% to 65%. Mild posterior basal hypokinesis. Doppler parameters are consistent with abnormal left ventricular relaxation (grade 1 diastolic dysfunction). The E/e' ratio is <10, suggesting normal LV filling pressure. - Mitral valve: Slight tethering of the posterior leaflet. Trace to mild regurgitation. - Left atrium: The atrium was normal in size.  Cardiac Catheterization: 01/2014 Coronary angiography: Coronary dominance: codominant  Left mainstem: No significant disease.   Left anterior descending (LAD): Mild luminal irregularities.  Left circumflex (LCx): Codominant vessel.  Small ramus with 40% ostial narrowing. There was total occlusion of the distal LCx after a moderate PLOM.    Right coronary artery (RCA): Codominant vessel.  No significant disease.   Left ventriculography: Left ventricular systolic function is normal, LVEF is estimated at 60-65%, there was basal inferior severe hypokinesis.   Final Conclusions:  Culprit lesion is total occlusion of the distal LCx.  There is an associated wall motion abnormality.  I discussed patient with Dr. Claiborne Billings.  We will proceed with PCI after ticagrelor load.   ANGIOGRAPHY:  Please refer to Dr. Osie Cheeks diagnostic catheterization report.  At the start of the intervention, the left circumflex coronary artery is a  dominant vessel that Torres rise to 2 major obtuse marginal branches and then was occluded immediately after the second major marginal branch. Following successful PCI with additional 2.0x12 mm Amerge balloon and ultimate stenting 100% occlusion with a science Alpine 2.25 mm x8 mm DES stent, postdilated to 2.3 mm, the 100% occlusion was reduced to 0% there was resumption of TIMI 3 flow. Just beyond the stent there was a moderate size third marginal branch and then after this were 2 additional smaller branches supplying the posterolateral and inferolateral myocardium.  IMPRESSION:  Successful percutaneous coronary intervention the distal left circumflex coronary artery with the 100% occlusion of the AV groove after the second marginal vessel being reduced to 0% reestablishment of flow to 3 additional branches of the distal circumflex which were beyond this occluded segment.   ASSESSMENT & PLAN:    1. CAD - No anginal symptoms. Continue ASA and statin. Continue to play tennis. EKG without acute changes.   2. HTN - minimally elevated DBP. Hx of intolerance to multiple medications per note. Followed by PCP.  3. HLD - Followed by PCP. Continue statin.   4. Surgical clearance  - He is easily getting > 4 Mets of activity without any limitations. Given past medical history and based on ACC/AHA guidelines, AVIRAJ KENTNER would be at acceptable risk for the planned procedure without further cardiovascular testing.  Will ask Dr. Saunders Torres of it okay to hold ASA 5-7 days prior to surgery if needed.   Addendum: Dr. Saunders Torres  "not recommend stopping aspirin unless the orthopedist is concerned about life-threatening bleeding with ASA".   Will route this recommendations to requesting provider by Epic.    Medication Adjustments/Labs and Tests Ordered: Current medicines are reviewed at length with the patient today.  Concerns regarding medicines are outlined above.  Medication changes, Labs and Tests ordered  today are listed in the Patient Instructions below. Patient Instructions  Medication Instructions: Your physician recommends that you continue on your current medications as directed. Please refer to the Current Medication list given to you today.   Labwork: None Ordered  Procedures/Testing: None Ordered  Follow-Up: Your physician wants you to follow-up in: 1 year with Dr.End You will receive a reminder letter in the mail two months in  advance. If you don't receive a letter, please call our office to schedule the follow-up appointment.   Any Additional Special Instructions Will Be Listed Below (If Applicable).     If you need a refill on your cardiac medications before your next appointment, please call your pharmacy.      Jarrett Soho, [place]  06/19/2018 2:20 PM    [address], [place], [place]  [address] Phone: [phone]; Fax: [phone]

## 2018-06-19 NOTE — Patient Instructions (Signed)
Medication Instructions: Your physician recommends that you continue on your current medications as directed. Please refer to the Current Medication list given to you today.   Labwork: None Ordered  Procedures/Testing: None Ordered  Follow-Up: Your physician wants you to follow-up in: 1 year with Dr.End You will receive a reminder letter in the mail two months in advance. If you don't receive a letter, please call our office to schedule the follow-up appointment.   Any Additional Special Instructions Will Be Listed Below (If Applicable).     If you need a refill on your cardiac medications before your next appointment, please call your pharmacy.

## 2018-07-01 DIAGNOSIS — Z01812 Encounter for preprocedural laboratory examination: Secondary | ICD-10-CM | POA: Diagnosis not present

## 2018-07-10 ENCOUNTER — Ambulatory Visit: Payer: BLUE CROSS/BLUE SHIELD | Admitting: Physician Assistant

## 2018-07-11 IMAGING — MR MR MRA NECK WO/W CM
4 of 5 series · 30 of 48 positions shown · IV contrast (multihance)
Comparison: CT head earlier today.

CLINICAL DATA: Intermittent RIGHT facial numbness.  Headache.

EXAM:
MR HEAD WITHOUT AND WITH CONTRAST
MR CIRCLE OF WILLIS WITHOUT CONTRAST
MRA OF THE NECK WITHOUT AND WITH CONTRAST
TECHNIQUE: Multiplanar, multiecho pulse sequences of the brain, circle of
Willis and surrounding structures were obtained without intravenous
contrast. Angiographic images of the neck were obtained using MRA
technique without and with intravenous contrast.
CONTRAST:  14mL MULTIHANCE GADOBENATE DIMEGLUMINE 529 MG/ML IV SOLN

[Series 4: fl_tof_2d · axial · 3.0mm · 0.49mm/px · z∈[-164,-38]mm · 8 of 65 slices shown]
[im 1/65]
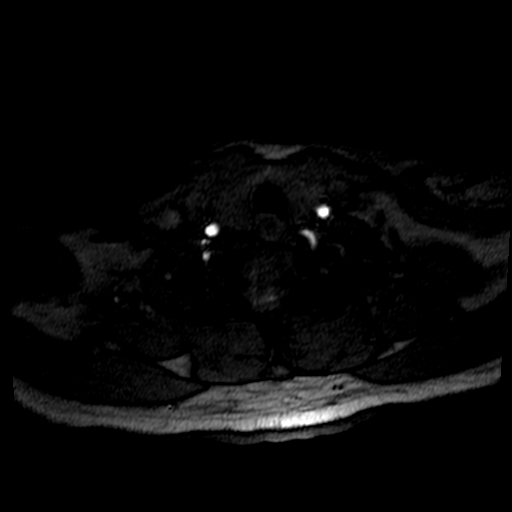
[im 10/65]
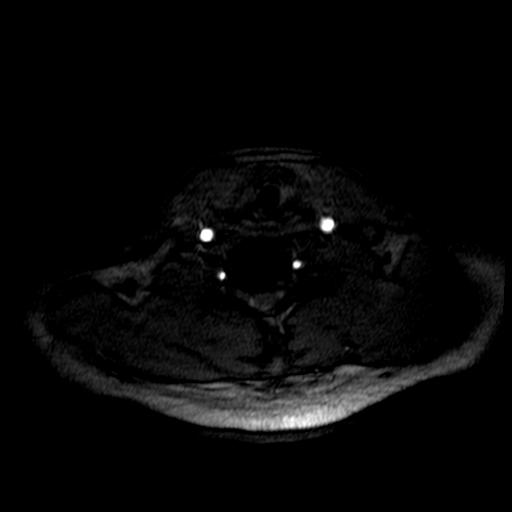
[im 19/65]
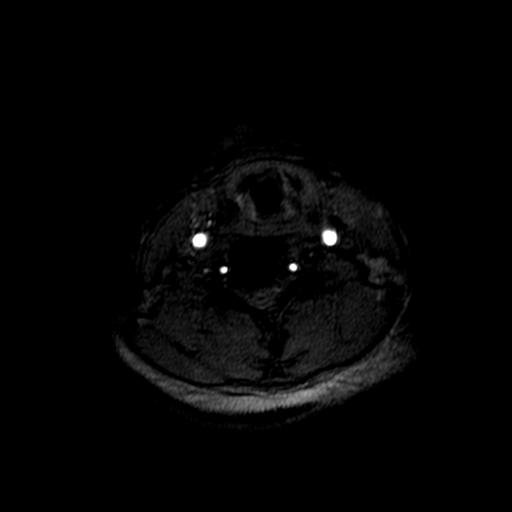
[im 28/65]
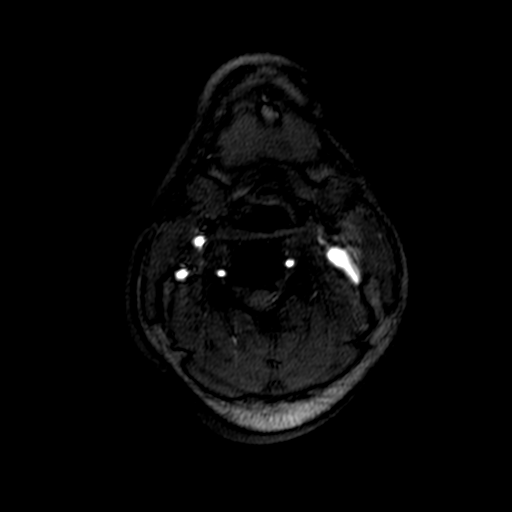
[im 37/65]
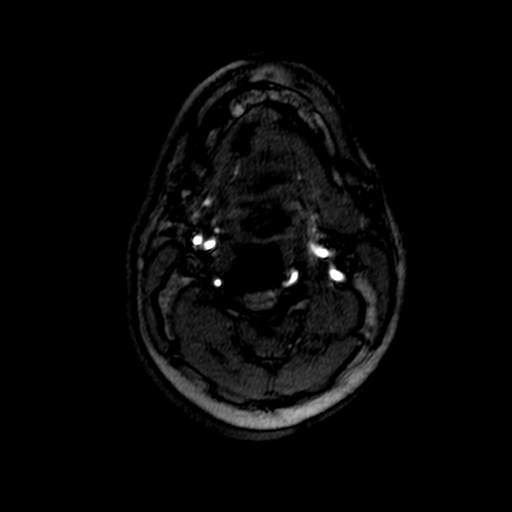
[im 46/65]
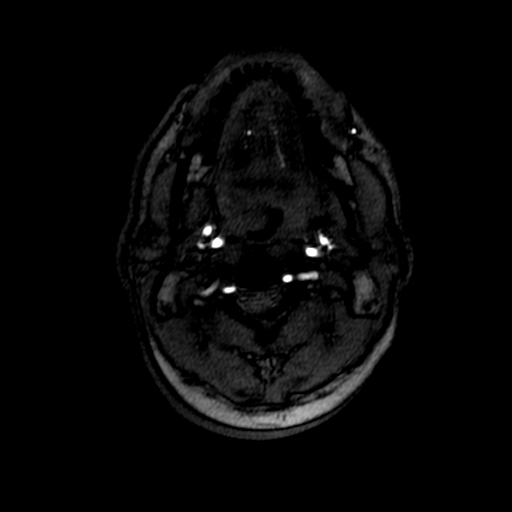
[im 55/65]
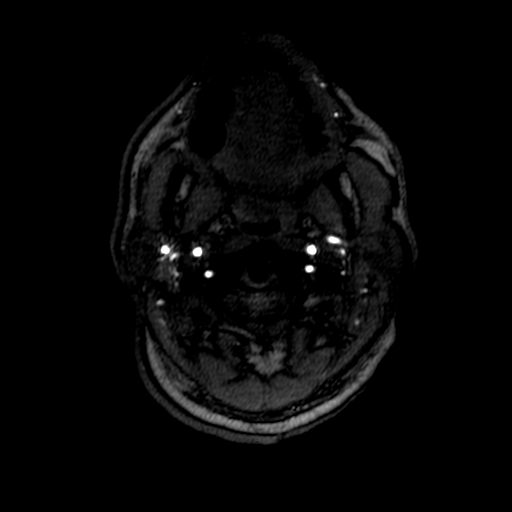
[im 65/65]
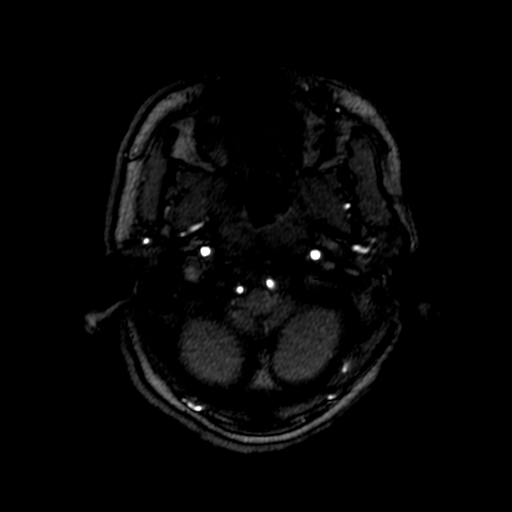

[Series 8: (id)_tt=1.0s · coronal · 0.8mm · 0.78mm/px · 9 of 96 slices shown (1 of 2)]
[im 1/96]
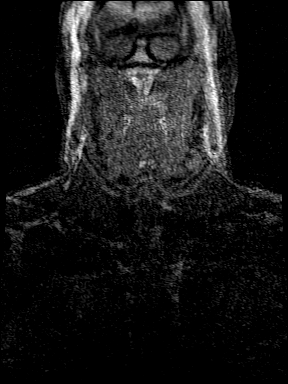
[im 16/96]
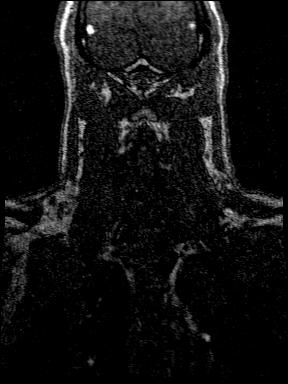
[im 32/96]
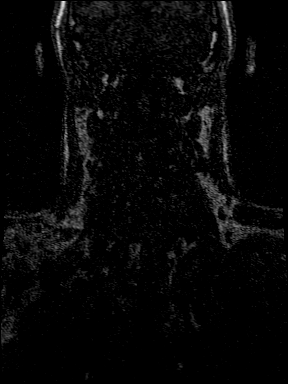
[im 40/96]
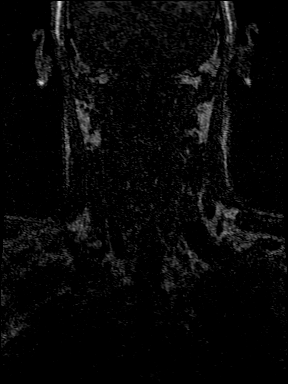
[im 48/96]
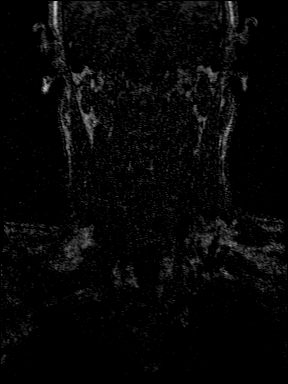
[im 56/96]
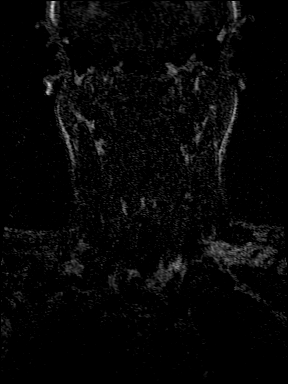
[im 64/96]
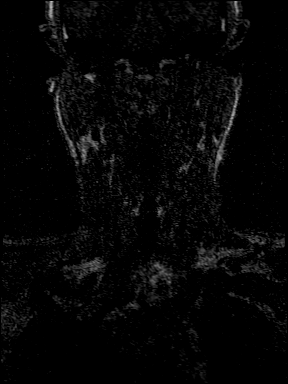
[im 80/96]
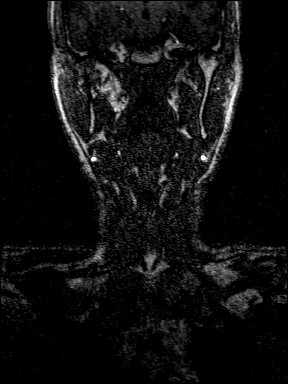
[im 96/96]
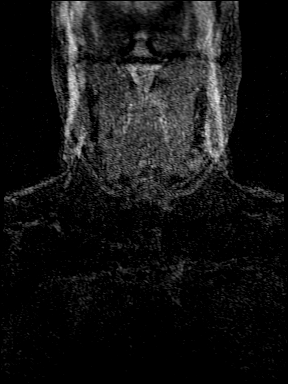

[Series 10: (id)_tt=1.0s · coronal · 0.8mm · 0.78mm/px · 9 of 94 slices shown (2 of 2)]
[im 1/94]
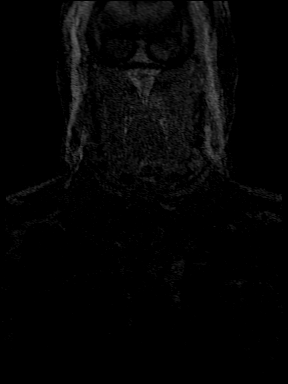
[im 16/94]
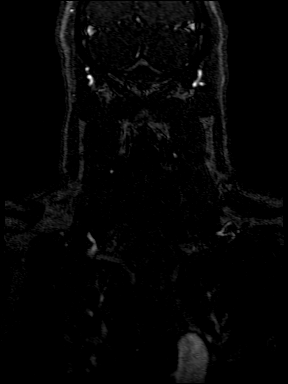
[im 32/94]
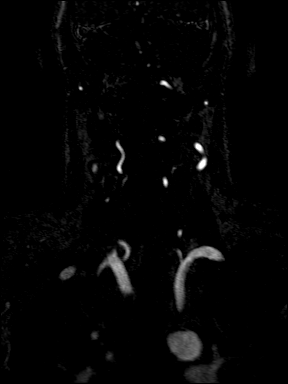
[im 39/94]
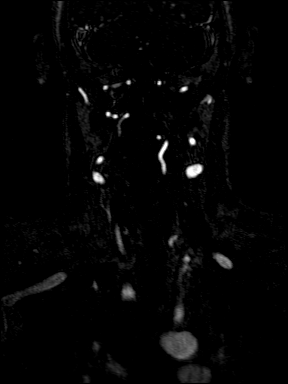
[im 47/94]
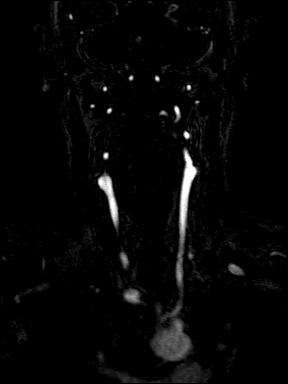
[im 55/94]
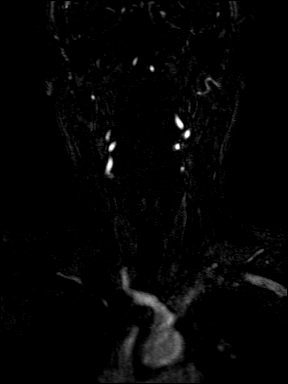
[im 63/94]
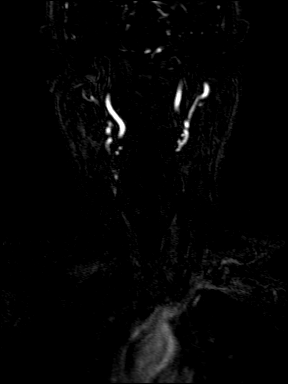
[im 78/94]
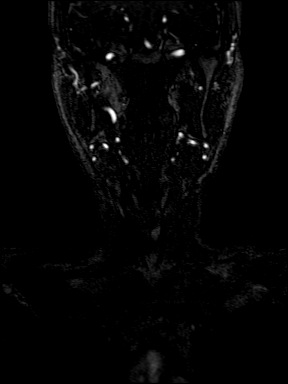
[im 94/94]
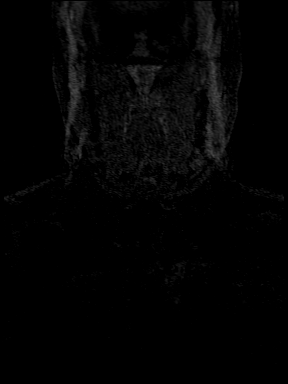

[Series 11: (id)_tt=1.0s_sub · coronal · 0.8mm · 0.78mm/px · 4 of 94 slices shown]
[im 1/94]
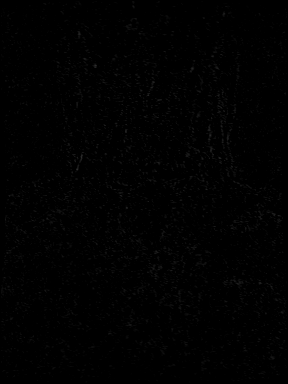
[im 16/94]
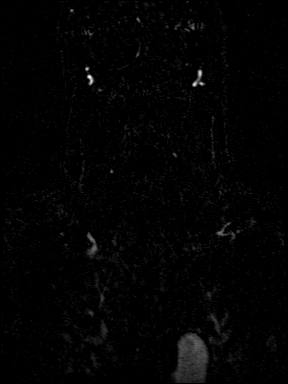
[im 47/94]
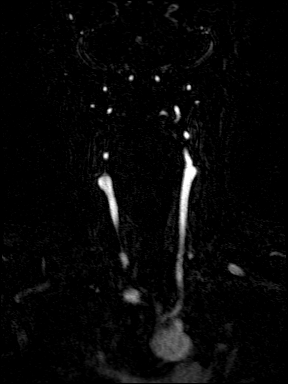
[im 78/94]
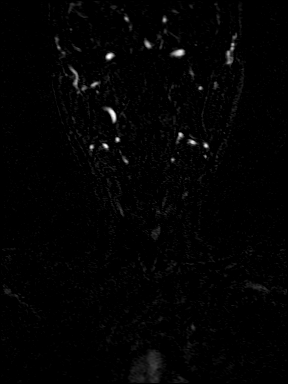

[30 of 48 positions shown; findings below may reference images not displayed]

FINDINGS: MR HEAD FINDINGS

Brain: There is a tentorial based extra-axial mass on the RIGHT, T2
hypointense causing moderate vasogenic edema of the RIGHT temporal
lobe and along the RIGHT pons. There is mass effect on the RIGHT
hippocampus and uncus. Facilitated diffusion in both. The mass
extends along the undersurface of the RIGHT temporal lobe, and
displaces the RIGHT trigeminal nerve inferiorly. Mass extends to the
RIGHT cavernous sinus, and medial sphenoid wing. Approximate
measurements are 40 x 50 x 20 mm, although the lesion is
pleomorphic.

Normal for age cerebral volume. No significant white matter disease.

Vascular: Normal flow voids.

Skull and upper cervical spine: Normal marrow signal.

Sinuses/Orbits: Chronic frontal, ethmoid, sphenoid, and maxillary
sinusitis, with acutely layering fluid in the LEFT maxillary region.
Dysconjugate gaze, with otherwise normal orbits.

Other: Review of earlier CT demonstrates edema, without visible
hemorrhage.

MR CIRCLE OF WILLIS FINDINGS

Internal carotid arteries are widely patent. Basilar artery is
widely patent. Vertebrals codominant. No intracranial stenosis or
aneurysm. No RIGHT PCA narrowing. Fetal origin LEFT PCA.

MRA NECK FINDINGS

Bovine trunk. Asymmetric narrow of the proximal LEFT common carotid
artery, estimated 50% stenosis. No other great vessel stenosis.

Carotid bifurcations free of disease. No atheromatous change or
dissection. Vertebral arteries codominant although a 50-75% stenosis
of the LEFT vertebral is suggested. RIGHT vertebral ostium is non
stenotic.
IMPRESSION: RIGHT tentorial meningioma, extending to the cavernous sinus on the
RIGHT as well as along the floor of the RIGHT middle cranial fossa.
Pleomorphic shape, with approximate cross-section of 4 x 5 x 2 cm.
Mass effect on the RIGHT pons and RIGHT trigeminal nerve could
account for facial symptoms.

Adjacent vasogenic edema and mass effect in the RIGHT temporal lobe,
potentially placing the patient at risk of seizures. Neurology and
Neurosurgery consultation on an elective basis recommended.

Unremarkable MRA of the intracranial circulation without narrowing,
although the RIGHT PCA is encased.

Proximal LEFT common carotid artery stenosis, estimated 50%. No
carotid bifurcation disease, dissection, or occlusion.

Codominant vertebrals, with a 50-75% ostial stenosis on the LEFT.

These results were called by telephone at the time of interpretation
on 05/11/2017 at [DATE] to Dr. KIRI JIM , who verbally
acknowledged these results.

## 2018-07-11 IMAGING — MR MR MRA HEAD W/O CM
1 series · 18 of 48 positions shown · IV contrast (multihance)
Comparison: CT head earlier today.

CLINICAL DATA: Intermittent RIGHT facial numbness.  Headache.

EXAM:
MR HEAD WITHOUT AND WITH CONTRAST
MR CIRCLE OF WILLIS WITHOUT CONTRAST
MRA OF THE NECK WITHOUT AND WITH CONTRAST
TECHNIQUE: Multiplanar, multiecho pulse sequences of the brain, circle of
Willis and surrounding structures were obtained without intravenous
contrast. Angiographic images of the neck were obtained using MRA
technique without and with intravenous contrast.
CONTRAST:  14mL MULTIHANCE GADOBENATE DIMEGLUMINE 529 MG/ML IV SOLN

[Series 2: tof_3d_multi-slab · axial · 0.5mm · 0.35mm/px · z∈[-28,+75]mm · 18 of 217 slices shown]
[im 1/217]
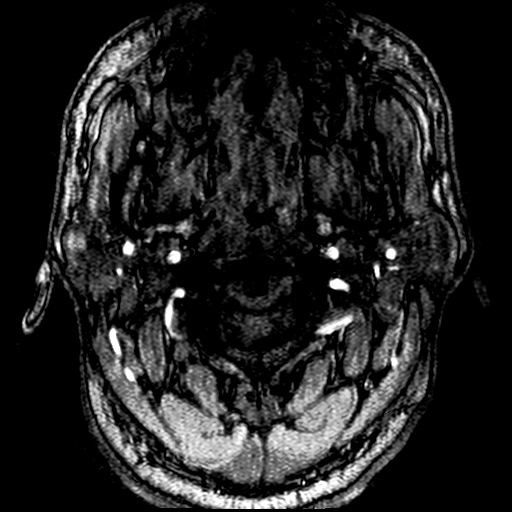
[im 5/217]
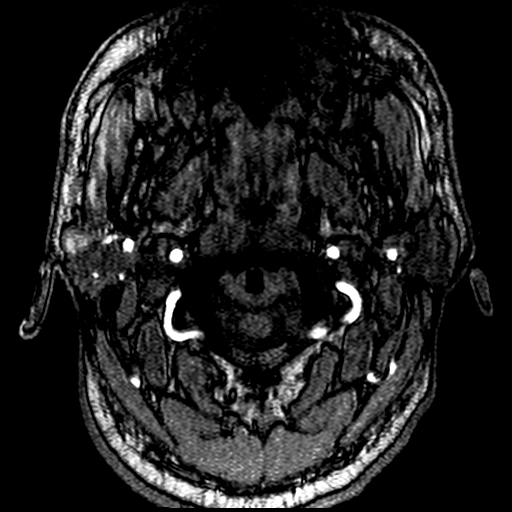
[im 10/217]
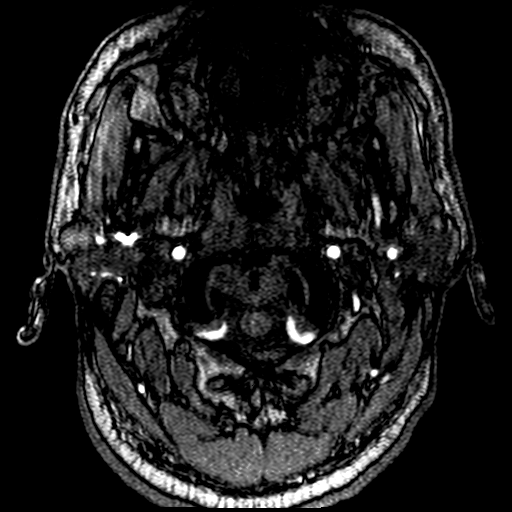
[im 14/217]
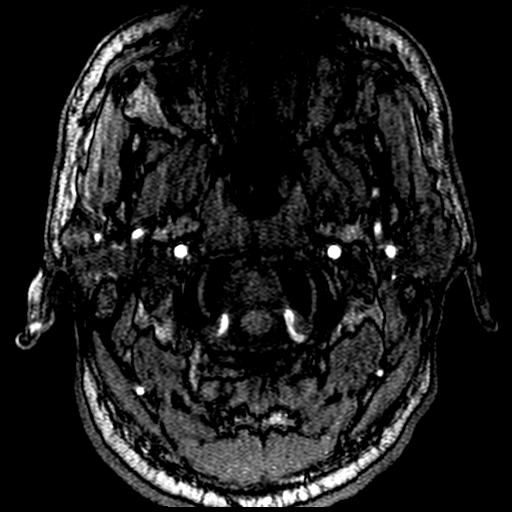
[im 19/217]
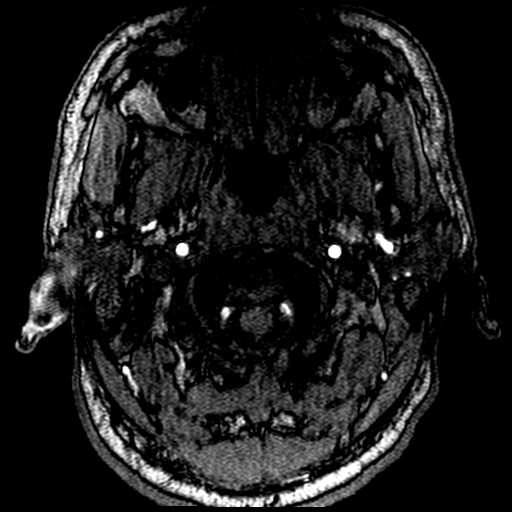
[im 23/217]
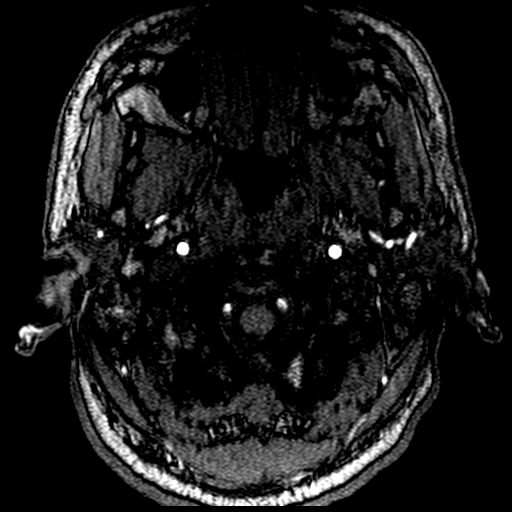
[im 28/217]
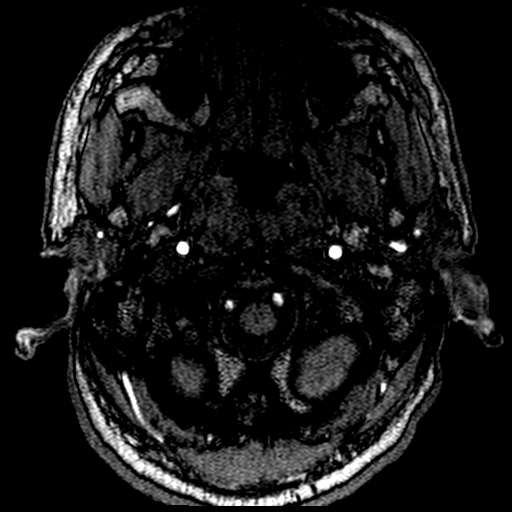
[im 33/217]
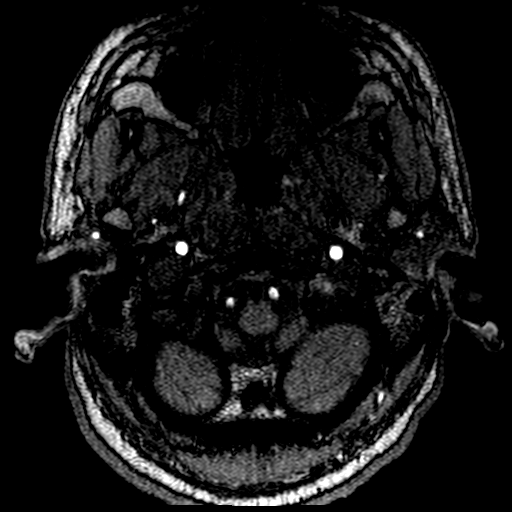
[im 37/217]
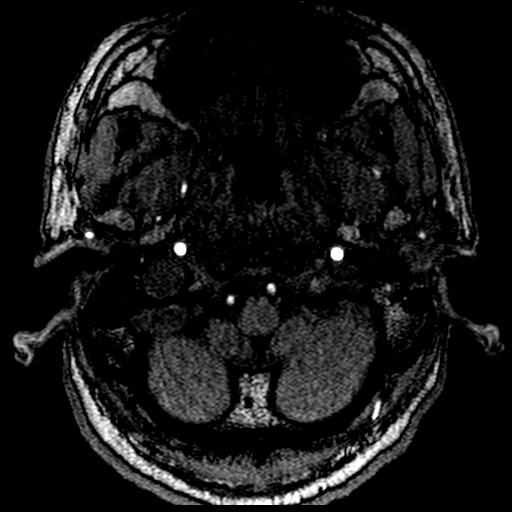
[im 42/217]
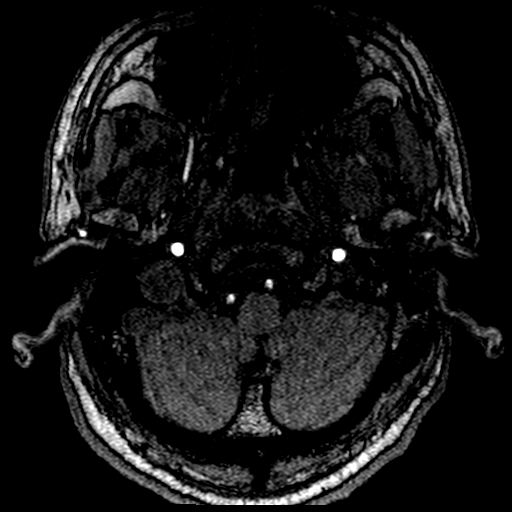
[im 69/217]
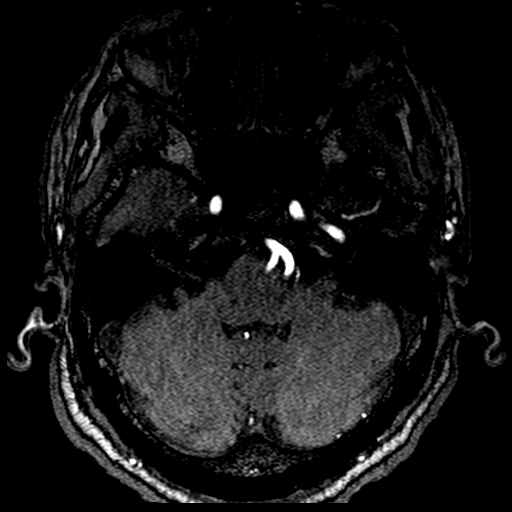
[im 97/217]
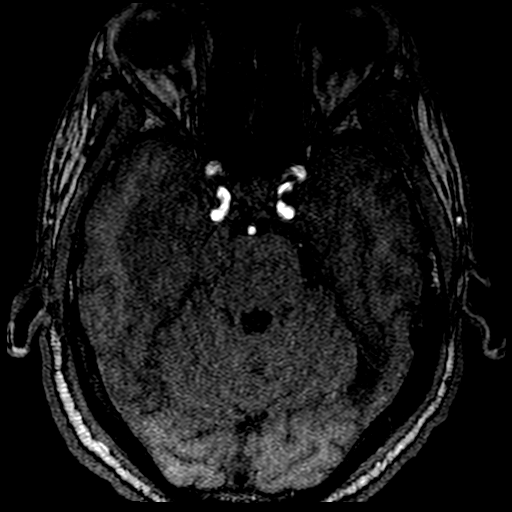
[im 111/217]
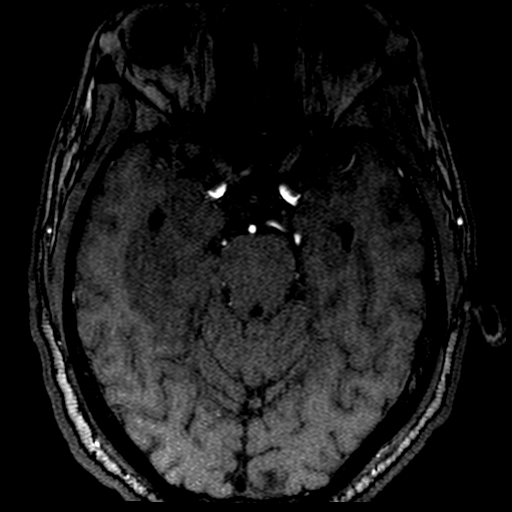
[im 125/217]
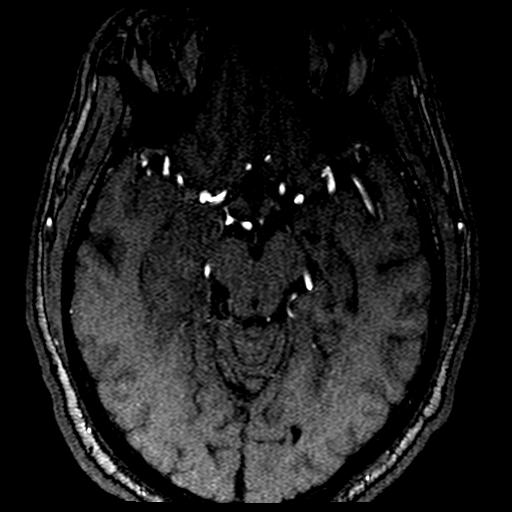
[im 152/217]
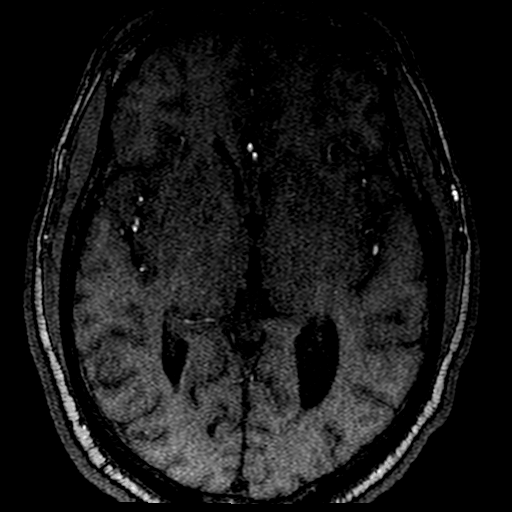
[im 180/217]
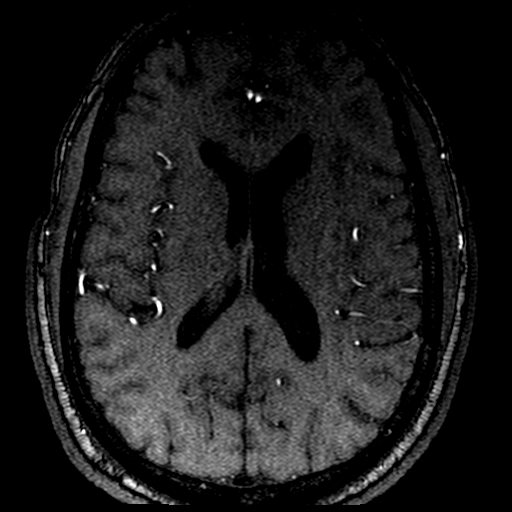
[im 184/217]
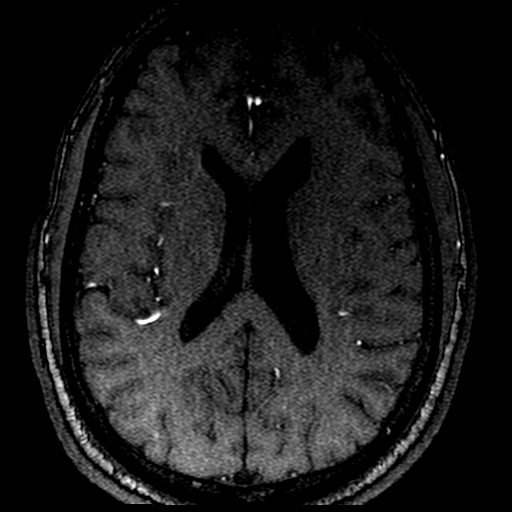
[im 207/217]
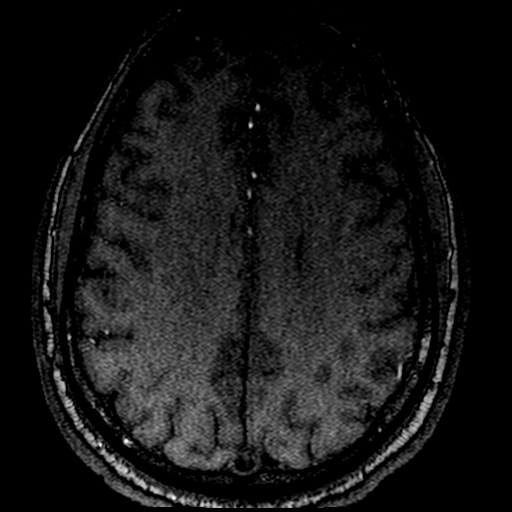

[18 of 48 positions shown; findings below may reference images not displayed]

FINDINGS: MR HEAD FINDINGS

Brain: There is a tentorial based extra-axial mass on the RIGHT, T2
hypointense causing moderate vasogenic edema of the RIGHT temporal
lobe and along the RIGHT pons. There is mass effect on the RIGHT
hippocampus and uncus. Facilitated diffusion in both. The mass
extends along the undersurface of the RIGHT temporal lobe, and
displaces the RIGHT trigeminal nerve inferiorly. Mass extends to the
RIGHT cavernous sinus, and medial sphenoid wing. Approximate
measurements are 40 x 50 x 20 mm, although the lesion is
pleomorphic.

Normal for age cerebral volume. No significant white matter disease.

Vascular: Normal flow voids.

Skull and upper cervical spine: Normal marrow signal.

Sinuses/Orbits: Chronic frontal, ethmoid, sphenoid, and maxillary
sinusitis, with acutely layering fluid in the LEFT maxillary region.
Dysconjugate gaze, with otherwise normal orbits.

Other: Review of earlier CT demonstrates edema, without visible
hemorrhage.

MR CIRCLE OF WILLIS FINDINGS

Internal carotid arteries are widely patent. Basilar artery is
widely patent. Vertebrals codominant. No intracranial stenosis or
aneurysm. No RIGHT PCA narrowing. Fetal origin LEFT PCA.

MRA NECK FINDINGS

Bovine trunk. Asymmetric narrow of the proximal LEFT common carotid
artery, estimated 50% stenosis. No other great vessel stenosis.

Carotid bifurcations free of disease. No atheromatous change or
dissection. Vertebral arteries codominant although a 50-75% stenosis
of the LEFT vertebral is suggested. RIGHT vertebral ostium is non
stenotic.
IMPRESSION: RIGHT tentorial meningioma, extending to the cavernous sinus on the
RIGHT as well as along the floor of the RIGHT middle cranial fossa.
Pleomorphic shape, with approximate cross-section of 4 x 5 x 2 cm.
Mass effect on the RIGHT pons and RIGHT trigeminal nerve could
account for facial symptoms.

Adjacent vasogenic edema and mass effect in the RIGHT temporal lobe,
potentially placing the patient at risk of seizures. Neurology and
Neurosurgery consultation on an elective basis recommended.

Unremarkable MRA of the intracranial circulation without narrowing,
although the RIGHT PCA is encased.

Proximal LEFT common carotid artery stenosis, estimated 50%. No
carotid bifurcation disease, dissection, or occlusion.

Codominant vertebrals, with a 50-75% ostial stenosis on the LEFT.

These results were called by telephone at the time of interpretation
on 05/11/2017 at [DATE] to Dr. KIRI JIM , who verbally
acknowledged these results.

## 2018-07-14 ENCOUNTER — Other Ambulatory Visit: Payer: Self-pay | Admitting: Orthopedic Surgery

## 2018-07-17 NOTE — Patient Instructions (Addendum)
Bryan Torres  07/17/2018   Your procedure is scheduled on: 07-28-18    Report to [facility] Main  Entrance     Report to Admitting at 6:30 AM    Call this number if you have problems the morning of surgery [phone]   Remember: Do not eat food or drink liquids :After Midnight.     Take these medicines the morning of surgery with A SIP OF WATER: None                                You may not have any metal on your body including hair pins and              piercings  Do not wear jewelry, lotions, powders, cologne or deodorant             Men may shave face and neck.   Do not bring valuables to the hospital. [place].  Contacts, dentures or bridgework may not be worn into surgery.  Leave suitcase in the car. After surgery it may be brought to your room.    :  Special Instructions: N/A              Please read over the following fact sheets you were given: _____________________________________________________________________             [facility] - Preparing for Surgery Before surgery, you can play an important role.  Because skin is not sterile, your skin needs to be as free of germs as possible.  You can reduce the number of germs on your skin by washing with CHG (chlorahexidine gluconate) soap before surgery.  CHG is an antiseptic cleaner which kills germs and bonds with the skin to continue killing germs even after washing. Please DO NOT use if you have an allergy to CHG or antibacterial soaps.  If your skin becomes reddened/irritated stop using the CHG and inform your nurse when you arrive at Short Stay. Do not shave (including legs and underarms) for at least 48 hours prior to the first CHG shower.  You may shave your face/neck. Please follow these instructions carefully:  1.  Shower with CHG Soap the night before surgery and the  morning of Surgery.  2.  If you choose to wash your hair,  wash your hair first as usual with your  normal  shampoo.  3.  After you shampoo, rinse your hair and body thoroughly to remove the  shampoo.                           4.  Use CHG as you would any other liquid soap.  You can apply chg directly  to the skin and wash                       Gently with a scrungie or clean washcloth.  5.  Apply the CHG Soap to your body ONLY FROM THE NECK DOWN.   Do not use on face/ open                           Wound or open  sores. Avoid contact with eyes, ears mouth and genitals (private parts).                       Wash face,  Genitals (private parts) with your normal soap.             6.  Wash thoroughly, paying special attention to the area where your surgery  will be performed.  7.  Thoroughly rinse your body with warm water from the neck down.  8.  DO NOT shower/wash with your normal soap after using and rinsing off  the CHG Soap.                9.  Pat yourself dry with a clean towel.            10.  Wear clean pajamas.            11.  Place clean sheets on your bed the night of your first shower and do not  sleep with pets. Day of Surgery : Do not apply any lotions/deodorants the morning of surgery.  Please wear clean clothes to the hospital/surgery center.  FAILURE TO FOLLOW THESE INSTRUCTIONS MAY RESULT IN THE CANCELLATION OF YOUR SURGERY PATIENT SIGNATURE_________________________________  NURSE SIGNATURE__________________________________  ________________________________________________________________________   Bryan Torres  An incentive spirometer is a tool that can help keep your lungs clear and active. This tool measures how well you are filling your lungs with each breath. Taking long deep breaths may help reverse or decrease the chance of developing breathing (pulmonary) problems (especially infection) following:  A long period of time when you are unable to move or be active. BEFORE THE PROCEDURE   If the spirometer includes an  indicator to show your best effort, your nurse or respiratory therapist will set it to a desired goal.  If possible, sit up straight or lean slightly forward. Try not to slouch.  Hold the incentive spirometer in an upright position. INSTRUCTIONS FOR USE  1. Sit on the edge of your bed if possible, or sit up as far as you can in bed or on a chair. 2. Hold the incentive spirometer in an upright position. 3. Breathe out normally. 4. Place the mouthpiece in your mouth and seal your lips tightly around it. 5. Breathe in slowly and as deeply as possible, raising the piston or the ball toward the top of the column. 6. Hold your breath for 3-5 seconds or for as long as possible. Allow the piston or ball to fall to the bottom of the column. 7. Remove the mouthpiece from your mouth and breathe out normally. 8. Rest for a few seconds and repeat Steps 1 through 7 at least 10 times every 1-2 hours when you are awake. Take your time and take a few normal breaths between deep breaths. 9. The spirometer may include an indicator to show your best effort. Use the indicator as a goal to work toward during each repetition. 10. After each set of 10 deep breaths, practice coughing to be sure your lungs are clear. If you have an incision (the cut made at the time of surgery), support your incision when coughing by placing a pillow or rolled up towels firmly against it. Once you are able to get out of bed, walk around indoors and cough well. You may stop using the incentive spirometer when instructed by your caregiver.  RISKS AND COMPLICATIONS  Take your time so you do not get dizzy or light-headed.  If you are in pain, you may need to take or ask for pain medication before doing incentive spirometry. It is harder to take a deep breath if you are having pain. AFTER USE  Rest and breathe slowly and easily.  It can be helpful to keep track of a log of your progress. Your caregiver can provide you with a simple table  to help with this. If you are using the spirometer at home, follow these instructions: Bryan Torres IF:   You are having difficultly using the spirometer.  You have trouble using the spirometer as often as instructed.  Your pain medication is not giving enough relief while using the spirometer.  You develop fever of 100.5 F (38.1 C) or higher. SEEK IMMEDIATE MEDICAL CARE IF:   You cough up bloody sputum that had not been present before.  You develop fever of 102 F (38.9 C) or greater.  You develop worsening pain at or near the incision site. MAKE SURE YOU:   Understand these instructions.  Will watch your condition.  Will get help right away if you are not doing well or get worse. Document Released: 03/18/2007 Document Revised: 01/28/2012 Document Reviewed: 05/19/2007 [employer] Patient Information 2014 [employer], [place].   ________________________________________________________________________  WHAT IS A BLOOD TRANSFUSION? Blood Transfusion Information  A transfusion is the replacement of blood or some of its parts. Blood is made up of multiple cells which provide different functions.  Red blood cells carry oxygen and are used for blood loss replacement.  White blood cells fight against infection.  Platelets control bleeding.  Plasma helps clot blood.  Other blood products are available for specialized needs, such as hemophilia or other clotting disorders. BEFORE THE TRANSFUSION  Who gives blood for transfusions?   Healthy volunteers who are fully evaluated to make sure their blood is safe. This is blood bank blood. Transfusion therapy is the safest it has ever been in the practice of medicine. Before blood is taken from a donor, a complete history is taken to make sure that person has no history of diseases nor engages in risky social behavior (examples are intravenous drug use or sexual activity with multiple partners). The donor's travel history is screened  to minimize risk of transmitting infections, such as malaria. The donated blood is tested for signs of infectious diseases, such as HIV and hepatitis. The blood is then tested to be sure it is compatible with you in order to minimize the chance of a transfusion reaction. If you or a relative donates blood, this is often done in anticipation of surgery and is not appropriate for emergency situations. It takes many days to process the donated blood. RISKS AND COMPLICATIONS Although transfusion therapy is very safe and saves many lives, the main dangers of transfusion include:   Getting an infectious disease.  Developing a transfusion reaction. This is an allergic reaction to something in the blood you were given. Every precaution is taken to prevent this. The decision to have a blood transfusion has been considered carefully by your caregiver before blood is given. Blood is not given unless the benefits outweigh the risks. AFTER THE TRANSFUSION  Right after receiving a blood transfusion, you will usually feel much better and more energetic. This is especially true if your red blood cells have gotten low (anemic). The transfusion raises the level of the red blood cells which carry oxygen, and this usually causes an energy increase.  The nurse administering the transfusion will monitor you carefully for  complications. HOME CARE INSTRUCTIONS  No special instructions are needed after a transfusion. You may find your energy is better. Speak with your caregiver about any limitations on activity for underlying diseases you may have. SEEK MEDICAL CARE IF:   Your condition is not improving after your transfusion.  You develop redness or irritation at the intravenous (IV) site. SEEK IMMEDIATE MEDICAL CARE IF:  Any of the following symptoms occur over the next 12 hours:  Shaking chills.  You have a temperature by mouth above 102 F (38.9 C), not controlled by medicine.  Chest, back, or muscle  pain.  People around you feel you are not acting correctly or are confused.  Shortness of breath or difficulty breathing.  Dizziness and fainting.  You get a rash or develop hives.  You have a decrease in urine output.  Your urine turns a dark color or changes to pink, red, or brown. Any of the following symptoms occur over the next 10 days:  You have a temperature by mouth above 102 F (38.9 C), not controlled by medicine.  Shortness of breath.  Weakness after normal activity.  The white part of the eye turns yellow (jaundice).  You have a decrease in the amount of urine or are urinating less often.  Your urine turns a dark color or changes to pink, red, or brown. Document Released: 11/02/2000 Document Revised: 01/28/2012 Document Reviewed: 06/21/2008 [facility] Patient Information 2014 [place], [place].  _______________________________________________________________________

## 2018-07-17 NOTE — Progress Notes (Signed)
06-19-18 (Epic) EKG, and Cardiac Clearance from Dr. Bland Span in office visit.

## 2018-07-22 ENCOUNTER — Encounter (HOSPITAL_COMMUNITY): Payer: Self-pay

## 2018-07-22 ENCOUNTER — Encounter (HOSPITAL_COMMUNITY)
Admission: RE | Admit: 2018-07-22 | Discharge: 2018-07-22 | Disposition: A | Discharge status: Home / Self Care | Payer: BLUE CROSS/BLUE SHIELD | Source: Ambulatory Visit | Attending: Orthopedic Surgery | Admitting: Orthopedic Surgery

## 2018-07-22 ENCOUNTER — Ambulatory Visit (HOSPITAL_COMMUNITY)
Admission: RE | Admit: 2018-07-22 | Discharge: 2018-07-22 | Disposition: A | Discharge status: Home / Self Care | Payer: BLUE CROSS/BLUE SHIELD | Source: Ambulatory Visit | Attending: Orthopedic Surgery | Admitting: Orthopedic Surgery

## 2018-07-22 ENCOUNTER — Other Ambulatory Visit: Payer: Self-pay

## 2018-07-22 DIAGNOSIS — Z01818 Encounter for other preprocedural examination: Secondary | ICD-10-CM | POA: Diagnosis not present

## 2018-07-22 DIAGNOSIS — M1711 Unilateral primary osteoarthritis, right knee: Secondary | ICD-10-CM | POA: Diagnosis not present

## 2018-07-22 LAB — BASIC METABOLIC PANEL
Anion gap: 12 (ref 5–15)
BUN: 30 mg/dL — AB (ref 6–20)
CHLORIDE: 108 mmol/L (ref 98–111)
CO2: 24 mmol/L (ref 22–32)
CREATININE: 1.19 mg/dL (ref 0.61–1.24)
Calcium: 9.2 mg/dL (ref 8.9–10.3)
GFR calc Af Amer: >60 mL/min (ref >60)
GFR calc non Af Amer: >60 mL/min (ref >60)
Glucose, Bld: 105 mg/dL — ABNORMAL HIGH (ref 70–99)
Potassium: 3.6 mmol/L (ref 3.5–5.1)
Sodium: 144 mmol/L (ref 135–145)

## 2018-07-22 LAB — CBC WITH DIFFERENTIAL/PLATELET
Basophils Absolute: 0.1 10*3/uL (ref 0.0–0.1)
Basophils Relative: 1 %
EOS ABS: 0.1 10*3/uL (ref 0.0–0.7)
EOS PCT: 2 %
HCT: 43.5 % (ref 39.0–52.0)
HEMOGLOBIN: 14.8 g/dL (ref 13.0–17.0)
Lymphocytes Relative: 32 %
Lymphs Abs: 1.7 10*3/uL (ref 0.7–4.0)
MCH: 27.4 pg (ref 26.0–34.0)
MCHC: 34 g/dL (ref 30.0–36.0)
MCV: 80.6 fL (ref 78.0–100.0)
MONO ABS: 0.7 10*3/uL (ref 0.1–1.0)
MONOS PCT: 14 %
Neutro Abs: 2.6 10*3/uL (ref 1.7–7.7)
Neutrophils Relative %: 51 %
PLATELETS: 258 10*3/uL (ref 150–400)
RBC: 5.4 MIL/uL (ref 4.22–5.81)
RDW: 16.6 % — ABNORMAL HIGH (ref 11.5–15.5)
WBC: 5.2 10*3/uL (ref 4.0–10.5)

## 2018-07-22 LAB — URINALYSIS, ROUTINE W REFLEX MICROSCOPIC
Bilirubin Urine: NEGATIVE
GLUCOSE, UA: NEGATIVE mg/dL
HGB URINE DIPSTICK: NEGATIVE
KETONES UR: NEGATIVE mg/dL
Leukocytes, UA: NEGATIVE
NITRITE: NEGATIVE
PH: 5 (ref 5.0–8.0)
Protein, ur: NEGATIVE mg/dL
SPECIFIC GRAVITY, URINE: 1.031 — AB (ref 1.005–1.030)

## 2018-07-22 LAB — SURGICAL PCR SCREEN
MRSA, PCR: NEGATIVE
STAPHYLOCOCCUS AUREUS: NEGATIVE

## 2018-07-22 LAB — PROTIME-INR
INR: 0.96
PROTHROMBIN TIME: 12.7 s (ref 11.4–15.2)

## 2018-07-22 LAB — APTT: aPTT: 29 seconds (ref 24–36)

## 2018-07-22 LAB — ABO/RH: ABO/RH(D): O POS

## 2018-07-22 NOTE — Progress Notes (Signed)
07-22-18 BMP and UA results routed to Dr. Mayer Camel for review.

## 2018-07-25 DIAGNOSIS — M1711 Unilateral primary osteoarthritis, right knee: Secondary | ICD-10-CM | POA: Diagnosis present

## 2018-07-25 NOTE — H&P (Signed)
TOTAL KNEE ADMISSION H&P  Patient is being admitted for right total knee arthroplasty.  Subjective:  Chief Complaint:right knee pain.  HPI: Bryan Torres, 57 y.o. male, has a history of pain and functional disability in the right knee due to arthritis and has failed non-surgical conservative treatments for greater than 12 weeks to includeNSAID's and/or analgesics, corticosteriod injections, flexibility and strengthening excercises, weight reduction as appropriate and activity modification.  Onset of symptoms was gradual, starting >10 years ago with gradually worsening course since that time. The patient noted prior procedures on the knee to include  ACL reconstruction on the right knee(s).  Patient currently rates pain in the right knee(s) at 10 out of 10 with activity. Patient has night pain, worsening of pain with activity and weight bearing, pain that interferes with activities of daily living, pain with passive range of motion, crepitus and joint swelling.  Patient has evidence of subchondral cysts, periarticular osteophytes, joint subluxation and joint space narrowing by imaging studies.   There is no active infection.  Patient Active Problem List   Diagnosis Date Noted  . Erectile dysfunction 12/25/2016  . Precordial chest pain 05/22/2014  . Coronary artery disease involving native coronary artery of native heart without angina pectoris 01/30/2014  . Hypokalemia 01/30/2014  . Hyperlipidemia LDL goal <70 01/30/2014  . Fever 01/30/2014  . History of non-ST elevation myocardial infarction (NSTEMI) 01/28/2014  . Essential hypertension 01/28/2014   Past Medical History:  Diagnosis Date  . CAD (coronary artery disease)    a. NSTEMI 01/2014 s/p DES-100% distal LCx  . GERD (gastroesophageal reflux disease)   . Hyperlipidemia   . Hypertension   . Hypokalemia   . NSTEMI (non-ST elevated myocardial infarction) ([place])   . Sarcoidosis of central nervous system     Past Surgical History:   Procedure Laterality Date  . KNEE SURGERY Right    reconstructive  . LEFT HEART CATHETERIZATION WITH CORONARY ANGIOGRAM N/A 01/28/2014   Procedure: LEFT HEART CATHETERIZATION WITH CORONARY ANGIOGRAM;  Surgeon: Larey Dresser, MD;  Location: [facility] CATH LAB;  Service: Cardiovascular;  Laterality: N/A;  . PERCUTANEOUS CORONARY STENT INTERVENTION (PCI-S)     40% ostial ramus, 100% distal LCx s/p DES, minor luminal irregularities elsewhere; 60-65%, there was basal inferior severe hypokinesis  . PERCUTANEOUS STENT INTERVENTION  01/28/2014   Procedure: PERCUTANEOUS STENT INTERVENTION;  Surgeon: Larey Dresser, MD;  Location: [facility] CATH LAB;  Service: Cardiovascular;;    No current facility-administered medications for this encounter.    Current Outpatient Medications  Medication Sig Dispense Refill Last Dose  . aspirin EC 81 MG EC tablet Take 1 tablet (81 mg total) by mouth daily. (Patient taking differently: Take 81 mg by mouth daily. )   Taking  . hydrochlorothiazide (HYDRODIURIL) 25 MG tablet Take 1 tablet (25 mg total) by mouth daily. 15 tablet 0 Taking  . potassium chloride SA (K-DUR,KLOR-CON) 20 MEQ tablet Take 20 mEq by mouth every evening.   Taking  . predniSONE (DELTASONE) 5 MG tablet Take 20 mg by mouth daily with breakfast.   2 Taking  . tamsulosin (FLOMAX) 0.4 MG CAPS capsule Take 0.4 mg by mouth daily at 3 pm.    Taking  . atorvastatin (LIPITOR) 80 MG tablet Take 1 tablet (80 mg total) by mouth daily at 6 PM. Please call and schedule an appt for further refills, 1st attempt (Patient not taking: Reported on 07/17/2018) 30 tablet 0 Not Taking at Unknown time  . methylPREDNISolone (MEDROL DOSEPAK) 4 MG  TBPK tablet [place] as directed (Patient not taking: Reported on 07/17/2018) 21 tablet 0 Not Taking at Unknown time  . sildenafil (REVATIO) 20 MG tablet TAKE THREE TABLETS BY MOUTH ONCE DAILY AS NEEDED FOR  ERECTILE  DYSFUNCTION (Patient not taking: Reported on 07/17/2018) 21 tablet 3 Not Taking at Unknown  time   Allergies  Allergen Reactions  . Amlodipine Other (See Comments)    Double vision    Social History   Tobacco Use  . Smoking status: Never Smoker  . Smokeless tobacco: Never Used  Substance Use Topics  . Alcohol use: Yes    Comment: occ    Family History  Problem Relation Age of Onset  . Lung disease Mother   . Hypertension Mother   . Other Father 59       agent  . Hypertension Father   . Coronary artery disease Cousin        multiple  . Coronary artery disease Other        aunts and uncles  . Stroke Sister 74  . Stroke Maternal Grandmother      Review of Systems  Constitutional: Negative.   HENT: Negative.   Eyes: Negative.   Respiratory: Negative.   Cardiovascular: Negative.   Gastrointestinal: Negative.   Genitourinary: Negative.   Musculoskeletal: Positive for joint pain.  Skin: Negative.   Neurological: Negative.   Endo/Heme/Allergies: Negative.   Psychiatric/Behavioral: Negative.     Objective:  Physical Exam  Constitutional: He is oriented to person, place, and time. He appears well-developed and well-nourished.  HENT:  Head: Normocephalic and atraumatic.  Eyes: Pupils are equal, round, and reactive to light.  Neck: Normal range of motion. Neck supple.  Cardiovascular: Intact distal pulses.  Respiratory: Effort normal.  Musculoskeletal: He exhibits tenderness.  No erythema or warmth.  Mild swelling in right knee.  Tenderness palpation along the medial joint line.  Patient with full range of motion both extension and flexion.  Patient with pain with range of motion.  Crepitus noted on range of motion.  Strength 5 out of 5 throughout.  Negative Lachman, posterior drawer, valgus/varus testing, McMurray testing.  Neurological: He is alert and oriented to person, place, and time.  Skin: Skin is warm and dry.  Psychiatric: He has a normal mood and affect. His behavior is normal. Judgment and thought content normal.    Vital signs in last 24  hours:    Labs:   Estimated body mass index is 30.01 kg/m as calculated from the following:   Height as of 07/22/18: 5' 8.5" (1.74 m).   Weight as of 07/22/18: 90.8 kg.   Imaging Review Plain radiographs demonstrate AP, Rosenberg, lateral and sunrise x-rays show bone-on-bone lateral compartment arthritis on [place] view lateral subluxation of the tibia on the [place] view as well.  Cannulated ACL screws are present.  Peripheral osteophytes and subchondral cysts medially and to a greater extent laterally.   Preoperative templating of the joint replacement has been completed, documented, and submitted to the Operating Room personnel in order to optimize intra-operative equipment management.   Anticipated LOS equal to or greater than 2 midnights due to - Age 72 and older with one or more of the following:  - Obesity  - Expected need for hospital services (PT, OT, Nursing) required for safe discharge    - Active co-morbidities: Sarcoidosis      Assessment/Plan:  End stage arthritis, right knee   The patient history, physical examination, clinical judgment of the provider and  imaging studies are consistent with end stage degenerative joint disease of the right knee(s) and total knee arthroplasty is deemed medically necessary. The treatment options including medical management, injection therapy arthroscopy and arthroplasty were discussed at length. The risks and benefits of total knee arthroplasty were presented and reviewed. The risks due to aseptic loosening, infection, stiffness, patella tracking problems, thromboembolic complications and other imponderables were discussed. The patient acknowledged the explanation, agreed to proceed with the plan and consent was signed. Patient is being admitted for inpatient treatment for surgery, pain control, PT, OT, prophylactic antibiotics, VTE prophylaxis, progressive ambulation and ADL's and discharge planning. The patient is planning to be  discharged home with home health services

## 2018-07-27 MED ORDER — BUPIVACAINE LIPOSOME 1.3 % IJ SUSP
20.0000 mL | Freq: Once | INTRAMUSCULAR | Status: DC
  Filled 2018-07-27: qty 20

## 2018-07-27 MED ORDER — TRANEXAMIC ACID 1000 MG/10ML IV SOLN
2000.0000 mg | INTRAVENOUS | Status: DC
  Filled 2018-07-27: qty 20

## 2018-07-28 ENCOUNTER — Encounter (HOSPITAL_COMMUNITY): Payer: Self-pay

## 2018-07-28 ENCOUNTER — Encounter (HOSPITAL_COMMUNITY)
Admission: RE | Disposition: A | Discharge status: Home / Self Care | Payer: Self-pay | Source: Ambulatory Visit | Attending: Orthopedic Surgery

## 2018-07-28 ENCOUNTER — Ambulatory Visit (HOSPITAL_COMMUNITY)
Admission: RE | Admit: 2018-07-28 | Discharge: 2018-07-28 | Disposition: A | Discharge status: Home / Self Care | Payer: BLUE CROSS/BLUE SHIELD | Source: Ambulatory Visit | Attending: Orthopedic Surgery | Admitting: Orthopedic Surgery

## 2018-07-28 ENCOUNTER — Ambulatory Visit (HOSPITAL_COMMUNITY): Payer: BLUE CROSS/BLUE SHIELD | Admitting: Registered Nurse

## 2018-07-28 DIAGNOSIS — Z955 Presence of coronary angioplasty implant and graft: Secondary | ICD-10-CM | POA: Diagnosis not present

## 2018-07-28 DIAGNOSIS — D8689 Sarcoidosis of other sites: Secondary | ICD-10-CM | POA: Diagnosis not present

## 2018-07-28 DIAGNOSIS — M1711 Unilateral primary osteoarthritis, right knee: Secondary | ICD-10-CM | POA: Diagnosis not present

## 2018-07-28 DIAGNOSIS — Z6829 Body mass index (BMI) 29.0-29.9, adult: Secondary | ICD-10-CM | POA: Insufficient documentation

## 2018-07-28 DIAGNOSIS — E785 Hyperlipidemia, unspecified: Secondary | ICD-10-CM | POA: Insufficient documentation

## 2018-07-28 DIAGNOSIS — Z7982 Long term (current) use of aspirin: Secondary | ICD-10-CM | POA: Insufficient documentation

## 2018-07-28 DIAGNOSIS — Z7952 Long term (current) use of systemic steroids: Secondary | ICD-10-CM | POA: Diagnosis not present

## 2018-07-28 DIAGNOSIS — E669 Obesity, unspecified: Secondary | ICD-10-CM | POA: Diagnosis not present

## 2018-07-28 DIAGNOSIS — I10 Essential (primary) hypertension: Secondary | ICD-10-CM | POA: Insufficient documentation

## 2018-07-28 DIAGNOSIS — I2510 Atherosclerotic heart disease of native coronary artery without angina pectoris: Secondary | ICD-10-CM | POA: Diagnosis not present

## 2018-07-28 DIAGNOSIS — Z79899 Other long term (current) drug therapy: Secondary | ICD-10-CM | POA: Insufficient documentation

## 2018-07-28 DIAGNOSIS — Z5309 Procedure and treatment not carried out because of other contraindication: Secondary | ICD-10-CM | POA: Insufficient documentation

## 2018-07-28 DIAGNOSIS — I252 Old myocardial infarction: Secondary | ICD-10-CM | POA: Insufficient documentation

## 2018-07-28 LAB — TYPE AND SCREEN
ABO/RH(D): O POS
Antibody Screen: NEGATIVE

## 2018-07-28 SURGERY — ARTHROPLASTY, KNEE, TOTAL
Anesthesia: Spinal | Site: Knee | Laterality: Right

## 2018-07-28 MED ORDER — MIDAZOLAM HCL 2 MG/2ML IJ SOLN
INTRAMUSCULAR | Status: AC
  Filled 2018-07-28: qty 2

## 2018-07-28 MED ORDER — BUPIVACAINE-EPINEPHRINE (PF) 0.5% -1:200000 IJ SOLN
INTRAMUSCULAR | Status: AC
  Filled 2018-07-28: qty 30

## 2018-07-28 MED ORDER — FENTANYL CITRATE (PF) 100 MCG/2ML IJ SOLN: 50.0000 ug | INTRAMUSCULAR | Status: DC | PRN

## 2018-07-28 MED ORDER — ONDANSETRON HCL 4 MG/2ML IJ SOLN
INTRAMUSCULAR | Status: AC
  Filled 2018-07-28: qty 2

## 2018-07-28 MED ORDER — TRANEXAMIC ACID 1000 MG/10ML IV SOLN
INTRAVENOUS | Status: AC
  Filled 2018-07-28: qty 10

## 2018-07-28 MED ORDER — LACTATED RINGERS IV SOLN
INTRAVENOUS | Status: DC
  Administered 2018-07-28: 1000 mL via INTRAVENOUS

## 2018-07-28 MED ORDER — HYDRALAZINE HCL 20 MG/ML IJ SOLN
INTRAMUSCULAR | Status: AC | PRN
  Administered 2018-07-28: 5 mg via INTRAVENOUS

## 2018-07-28 MED ORDER — TRANEXAMIC ACID 1000 MG/10ML IV SOLN: 1000.0000 mg | INTRAVENOUS | Status: DC

## 2018-07-28 MED ORDER — FENTANYL CITRATE (PF) 100 MCG/2ML IJ SOLN
INTRAMUSCULAR | Status: AC
  Filled 2018-07-28: qty 2

## 2018-07-28 MED ORDER — CEFAZOLIN SODIUM-DEXTROSE 2-4 GM/100ML-% IV SOLN
INTRAVENOUS | Status: AC
  Filled 2018-07-28: qty 100

## 2018-07-28 MED ORDER — PROPOFOL 10 MG/ML IV BOLUS
INTRAVENOUS | Status: AC
  Filled 2018-07-28: qty 40

## 2018-07-28 MED ORDER — MIDAZOLAM HCL 2 MG/2ML IJ SOLN: 1.0000 mg | INTRAMUSCULAR | Status: DC | PRN

## 2018-07-28 MED ORDER — CEFAZOLIN SODIUM-DEXTROSE 2-4 GM/100ML-% IV SOLN: 2.0000 g | INTRAVENOUS | Status: DC

## 2018-07-28 MED ORDER — SODIUM CHLORIDE 0.9 % IJ SOLN
INTRAMUSCULAR | Status: AC
  Filled 2018-07-28: qty 50

## 2018-07-28 MED ORDER — PROPOFOL 10 MG/ML IV BOLUS
INTRAVENOUS | Status: AC
  Filled 2018-07-28: qty 20

## 2018-07-28 MED ORDER — CHLORHEXIDINE GLUCONATE 4 % EX LIQD: 60.0000 mL | Freq: Once | CUTANEOUS | Status: DC

## 2018-07-28 MED ORDER — DEXAMETHASONE SODIUM PHOSPHATE 10 MG/ML IJ SOLN
INTRAMUSCULAR | Status: AC
  Filled 2018-07-28: qty 1

## 2018-07-28 MED ORDER — SODIUM CHLORIDE 0.9 % IJ SOLN
INTRAMUSCULAR | Status: AC
  Filled 2018-07-28: qty 10

## 2018-07-28 MED ORDER — HYDRALAZINE HCL 20 MG/ML IJ SOLN
INTRAMUSCULAR | Status: AC
  Filled 2018-07-28: qty 1

## 2018-07-28 NOTE — Anesthesia Preprocedure Evaluation (Addendum)
Anesthesia Evaluation    Reviewed: Allergy & Precautions, Patient's Chart, lab work & pertinent test results  Airway        Dental   Pulmonary neg pulmonary ROS,           Cardiovascular hypertension, Pt. on medications + CAD and + Past MI       Neuro/Psych negative neurological ROS     GI/Hepatic Neg liver ROS, GERD  ,  Endo/Other  negative endocrine ROS  Renal/GU negative Renal ROS     Musculoskeletal  (+) Arthritis ,   Abdominal   Peds  Hematology negative hematology ROS (+)   Anesthesia Other Findings   Reproductive/Obstetrics                             Lab Results  Component Value Date   WBC 5.2 07/22/2018   HGB 14.8 07/22/2018   HCT 43.5 07/22/2018   MCV 80.6 07/22/2018   PLT 258 07/22/2018   Lab Results  Component Value Date   INR 0.96 07/22/2018   INR 1.01 01/28/2014     Anesthesia Physical Anesthesia Plan  ASA: II  Anesthesia Plan: Spinal   Post-op Pain Management:  Regional for Post-op pain   Induction: Intravenous  PONV Risk Score and Plan: 2 and Ondansetron, Dexamethasone, Propofol infusion and Midazolam  Airway Management Planned: Natural Airway and Simple Face Mask  Additional Equipment: None  Intra-op Plan:   Post-operative Plan:   Informed Consent:   Plan Discussed with:   Anesthesia Plan Comments: (Cancelled due to elevated BP's, patient will follow up with PCP. Denies active cardiac symptoms. )       Anesthesia Quick Evaluation

## 2018-07-28 NOTE — Interval H&P Note (Signed)
History and Physical Interval Note:  07/28/2018 7:03 AM  Bryan Torres  has presented today for surgery, with the diagnosis of RIGHT KNEE OSTEOARTHRITIS  The various methods of treatment have been discussed with the patient and family. After consideration of risks, benefits and other options for treatment, the patient has consented to  Procedure(s): RIGHT TOTAL KNEE ARTHROPLASTY (Right) as a surgical intervention .  The patient's history has been reviewed, patient examined, no change in status, stable for surgery.  I have reviewed the patient's chart and labs.  Questions were answered to the patient's satisfaction.     Kerin Salen

## 2018-07-28 NOTE — Progress Notes (Signed)
Procedure cancelled by Dr. Smith Robert and Dr. Mayer Camel. Pt BP elevated after IV medication given. Pt safe to go home, family made aware, and follow-up instructions given.

## 2018-07-30 DIAGNOSIS — Z7952 Long term (current) use of systemic steroids: Secondary | ICD-10-CM | POA: Diagnosis not present

## 2018-07-30 DIAGNOSIS — Z5309 Procedure and treatment not carried out because of other contraindication: Secondary | ICD-10-CM | POA: Diagnosis not present

## 2018-07-30 DIAGNOSIS — I2510 Atherosclerotic heart disease of native coronary artery without angina pectoris: Secondary | ICD-10-CM | POA: Diagnosis not present

## 2018-07-30 DIAGNOSIS — Z79899 Other long term (current) drug therapy: Secondary | ICD-10-CM | POA: Diagnosis not present

## 2018-07-30 DIAGNOSIS — Z6829 Body mass index (BMI) 29.0-29.9, adult: Secondary | ICD-10-CM | POA: Diagnosis not present

## 2018-07-30 DIAGNOSIS — E785 Hyperlipidemia, unspecified: Secondary | ICD-10-CM | POA: Diagnosis not present

## 2018-07-30 DIAGNOSIS — M1711 Unilateral primary osteoarthritis, right knee: Secondary | ICD-10-CM | POA: Diagnosis not present

## 2018-07-30 DIAGNOSIS — I252 Old myocardial infarction: Secondary | ICD-10-CM | POA: Diagnosis not present

## 2018-07-30 DIAGNOSIS — Z955 Presence of coronary angioplasty implant and graft: Secondary | ICD-10-CM | POA: Diagnosis not present

## 2018-07-30 DIAGNOSIS — E669 Obesity, unspecified: Secondary | ICD-10-CM | POA: Diagnosis not present

## 2018-07-30 DIAGNOSIS — D8689 Sarcoidosis of other sites: Secondary | ICD-10-CM | POA: Diagnosis not present

## 2018-07-30 DIAGNOSIS — Z7982 Long term (current) use of aspirin: Secondary | ICD-10-CM | POA: Diagnosis not present

## 2018-07-30 DIAGNOSIS — I10 Essential (primary) hypertension: Secondary | ICD-10-CM | POA: Diagnosis not present

## 2018-08-07 DIAGNOSIS — R202 Paresthesia of skin: Secondary | ICD-10-CM | POA: Diagnosis not present

## 2018-08-07 DIAGNOSIS — I10 Essential (primary) hypertension: Secondary | ICD-10-CM | POA: Diagnosis not present

## 2018-08-14 DIAGNOSIS — D8689 Sarcoidosis of other sites: Secondary | ICD-10-CM | POA: Diagnosis not present

## 2018-08-14 DIAGNOSIS — Z79899 Other long term (current) drug therapy: Secondary | ICD-10-CM | POA: Diagnosis not present

## 2018-08-20 DIAGNOSIS — I10 Essential (primary) hypertension: Secondary | ICD-10-CM | POA: Diagnosis not present

## 2018-08-20 DIAGNOSIS — M179 Osteoarthritis of knee, unspecified: Secondary | ICD-10-CM | POA: Diagnosis not present

## 2018-08-21 ENCOUNTER — Other Ambulatory Visit: Payer: Self-pay | Admitting: Orthopedic Surgery

## 2018-09-08 DIAGNOSIS — D869 Sarcoidosis, unspecified: Secondary | ICD-10-CM | POA: Diagnosis not present

## 2018-09-08 DIAGNOSIS — G939 Disorder of brain, unspecified: Secondary | ICD-10-CM | POA: Diagnosis not present

## 2018-09-08 DIAGNOSIS — D8689 Sarcoidosis of other sites: Secondary | ICD-10-CM | POA: Diagnosis not present

## 2018-09-10 ENCOUNTER — Other Ambulatory Visit (HOSPITAL_COMMUNITY): Payer: Self-pay | Admitting: *Deleted

## 2018-09-10 NOTE — Patient Instructions (Addendum)
Bryan Torres  09/10/2018   Your procedure is scheduled on: 09-15-18 Monday  Report to [facility] Main  Entrance              Report to admitting at 930 AM    Call this number if you have problems the morning of surgery [phone]    Remember: Do not eat food or drink liquids :After Midnight.              BRUSH YOUR TEETH MORNING OF SURGERY AND RINSE YOUR MOUTH OUT, NO CHEWING GUM CANDY OR MINTS.     Take these medicines the morning of surgery with A SIP OF WATER: prednisone                               You may not have any metal on your body including hair pins and              piercings  Do not wear jewelry, make-up, lotions, powders or perfumes, deodorant              Men may shave face and neck.   Do not bring valuables to the hospital. [name].  Contacts, dentures or bridgework may not be worn into surgery.  Leave suitcase in the car. After surgery it may be brought to your room.                Please read over the following fact sheets you were given: _____________________________________________________________________             [facility] - Preparing for Surgery Before surgery, you can play an important role.  Because skin is not sterile, your skin needs to be as free of germs as possible.  You can reduce the number of germs on your skin by washing with CHG (chlorahexidine gluconate) soap before surgery.  CHG is an antiseptic cleaner which kills germs and bonds with the skin to continue killing germs even after washing. Please DO NOT use if you have an allergy to CHG or antibacterial soaps.  If your skin becomes reddened/irritated stop using the CHG and inform your nurse when you arrive at Short Stay. Do not shave (including legs and underarms) for at least 48 hours prior to the first CHG shower.  You may shave your face/neck. Please follow these instructions carefully:  1.  Shower  with CHG Soap the night before surgery and the  morning of Surgery.  2.  If you choose to wash your hair, wash your hair first as usual with your  normal  shampoo.  3.  After you shampoo, rinse your hair and body thoroughly to remove the  shampoo.                           4.  Use CHG as you would any other liquid soap.  You can apply chg directly  to the skin and wash                       Gently with a scrungie or clean washcloth.  5.  Apply the CHG Soap to your body ONLY FROM THE NECK DOWN.   Do  not use on face/ open                           Wound or open sores. Avoid contact with eyes, ears mouth and genitals (private parts).                       Wash face,  Genitals (private parts) with your normal soap.             6.  Wash thoroughly, paying special attention to the area where your surgery  will be performed.  7.  Thoroughly rinse your body with warm water from the neck down.  8.  DO NOT shower/wash with your normal soap after using and rinsing off  the CHG Soap.                9.  Pat yourself dry with a clean towel.            10.  Wear clean pajamas.            11.  Place clean sheets on your bed the night of your first shower and do not  sleep with pets. Day of Surgery : Do not apply any lotions/deodorants the morning of surgery.  Please wear clean clothes to the hospital/surgery center.  FAILURE TO FOLLOW THESE INSTRUCTIONS MAY RESULT IN THE CANCELLATION OF YOUR SURGERY PATIENT SIGNATURE_________________________________  NURSE SIGNATURE__________________________________  ________________________________________________________________________   Bryan Torres  An incentive spirometer is a tool that can help keep your lungs clear and active. This tool measures how well you are filling your lungs with each breath. Taking long deep breaths may help reverse or decrease the chance of developing breathing (pulmonary) problems (especially infection) following:  A long period  of time when you are unable to move or be active. BEFORE THE PROCEDURE   If the spirometer includes an indicator to show your best effort, your nurse or respiratory therapist will set it to a desired goal.  If possible, sit up straight or lean slightly forward. Try not to slouch.  Hold the incentive spirometer in an upright position. INSTRUCTIONS FOR USE  1. Sit on the edge of your bed if possible, or sit up as far as you can in bed or on a chair. 2. Hold the incentive spirometer in an upright position. 3. Breathe out normally. 4. Place the mouthpiece in your mouth and seal your lips tightly around it. 5. Breathe in slowly and as deeply as possible, raising the piston or the ball toward the top of the column. 6. Hold your breath for 3-5 seconds or for as long as possible. Allow the piston or ball to fall to the bottom of the column. 7. Remove the mouthpiece from your mouth and breathe out normally. 8. Rest for a few seconds and repeat Steps 1 through 7 at least 10 times every 1-2 hours when you are awake. Take your time and take a few normal breaths between deep breaths. 9. The spirometer may include an indicator to show your best effort. Use the indicator as a goal to work toward during each repetition. 10. After each set of 10 deep breaths, practice coughing to be sure your lungs are clear. If you have an incision (the cut made at the time of surgery), support your incision when coughing by placing a pillow or rolled up towels firmly against it. Once you are able to get out of bed,  walk around indoors and cough well. You may stop using the incentive spirometer when instructed by your caregiver.  RISKS AND COMPLICATIONS  Take your time so you do not get dizzy or light-headed.  If you are in pain, you may need to take or ask for pain medication before doing incentive spirometry. It is harder to take a deep breath if you are having pain. AFTER USE  Rest and breathe slowly and easily.  It  can be helpful to keep track of a log of your progress. Your caregiver can provide you with a simple table to help with this. If you are using the spirometer at home, follow these instructions: [place] IF:   You are having difficultly using the spirometer.  You have trouble using the spirometer as often as instructed.  Your pain medication is not giving enough relief while using the spirometer.  You develop fever of 100.5 F (38.1 C) or higher. SEEK IMMEDIATE MEDICAL CARE IF:   You cough up bloody sputum that had not been present before.  You develop fever of 102 F (38.9 C) or greater.  You develop worsening pain at or near the incision site. MAKE SURE YOU:   Understand these instructions.  Will watch your condition.  Will get help right away if you are not doing well or get worse. Document Released: 03/18/2007 Document Revised: 01/28/2012 Document Reviewed: 05/19/2007 [facility] Patient Information 2014 [place], [place].   ________________________________________________________________________

## 2018-09-10 NOTE — Progress Notes (Signed)
ekg 06-19-18 epic Chest xray 07-22-18 epic Cardiac clearance note bhavikmuar bhagat  Pa 06-19-18 epic

## 2018-09-11 ENCOUNTER — Other Ambulatory Visit: Payer: Self-pay

## 2018-09-11 ENCOUNTER — Encounter (HOSPITAL_COMMUNITY): Payer: Self-pay

## 2018-09-11 ENCOUNTER — Encounter (HOSPITAL_COMMUNITY)
Admission: RE | Admit: 2018-09-11 | Discharge: 2018-09-11 | Disposition: A | Discharge status: Home / Self Care | Payer: BLUE CROSS/BLUE SHIELD | Source: Ambulatory Visit | Attending: Orthopedic Surgery | Admitting: Orthopedic Surgery

## 2018-09-11 DIAGNOSIS — Z01812 Encounter for preprocedural laboratory examination: Secondary | ICD-10-CM | POA: Insufficient documentation

## 2018-09-11 LAB — SURGICAL PCR SCREEN
MRSA, PCR: NEGATIVE
Staphylococcus aureus: NEGATIVE

## 2018-09-11 LAB — URINALYSIS, ROUTINE W REFLEX MICROSCOPIC
BILIRUBIN URINE: NEGATIVE
Glucose, UA: NEGATIVE mg/dL
HGB URINE DIPSTICK: NEGATIVE
KETONES UR: NEGATIVE mg/dL
Leukocytes, UA: NEGATIVE
Nitrite: NEGATIVE
PH: 6 (ref 5.0–8.0)
Protein, ur: NEGATIVE mg/dL
SPECIFIC GRAVITY, URINE: 1.01 (ref 1.005–1.030)

## 2018-09-11 LAB — CBC WITH DIFFERENTIAL/PLATELET
ABS IMMATURE GRANULOCYTES: 0.06 10*3/uL (ref 0.00–0.07)
Basophils Absolute: 0 10*3/uL (ref 0.0–0.1)
Basophils Relative: 1 %
EOS PCT: 1 %
Eosinophils Absolute: 0.1 10*3/uL (ref 0.0–0.5)
HCT: 47.1 % (ref 39.0–52.0)
Hemoglobin: 15.2 g/dL (ref 13.0–17.0)
Immature Granulocytes: 1 %
Lymphocytes Relative: 9 %
Lymphs Abs: 0.5 10*3/uL — ABNORMAL LOW (ref 0.7–4.0)
MCH: 27.2 pg (ref 26.0–34.0)
MCHC: 32.3 g/dL (ref 30.0–36.0)
MCV: 84.3 fL (ref 80.0–100.0)
MONO ABS: 0.2 10*3/uL (ref 0.1–1.0)
Monocytes Relative: 4 %
NEUTROS ABS: 4.5 10*3/uL (ref 1.7–7.7)
Neutrophils Relative %: 84 %
Platelets: 288 10*3/uL (ref 150–400)
RBC: 5.59 MIL/uL (ref 4.22–5.81)
RDW: 13.7 % (ref 11.5–15.5)
WBC: 5.3 10*3/uL (ref 4.0–10.5)
nRBC: 0 % (ref 0.0–0.2)

## 2018-09-11 LAB — BASIC METABOLIC PANEL
Anion gap: 9 (ref 5–15)
BUN: 21 mg/dL — AB (ref 6–20)
CO2: 26 mmol/L (ref 22–32)
Calcium: 8.9 mg/dL (ref 8.9–10.3)
Chloride: 104 mmol/L (ref 98–111)
Creatinine, Ser: 1.21 mg/dL (ref 0.61–1.24)
GFR calc Af Amer: >60 mL/min (ref >60)
Glucose, Bld: 136 mg/dL — ABNORMAL HIGH (ref 70–99)
POTASSIUM: 4.1 mmol/L (ref 3.5–5.1)
SODIUM: 139 mmol/L (ref 135–145)

## 2018-09-11 LAB — PROTIME-INR
INR: 0.87
PROTHROMBIN TIME: 11.8 s (ref 11.4–15.2)

## 2018-09-11 LAB — APTT: aPTT: 31 seconds (ref 24–36)

## 2018-09-12 NOTE — H&P (Signed)
TOTAL KNEE ADMISSION H&P  Patient is being admitted for right total knee arthroplasty.  Subjective:  Chief Complaint:right knee pain.  HPI: Bryan Torres, 57 y.o. male, has a history of pain and functional disability in the right knee due to arthritis and has failed non-surgical conservative treatments for greater than 12 weeks to includeNSAID's and/or analgesics, corticosteriod injections and activity modification.  Onset of symptoms was gradual, starting 1 years ago with gradually worsening course since that time. The patient noted prior procedures on the knee to include  arthroscopy and ACL reconstruction on the right knee(s).  Patient currently rates pain in the right knee(s) at 10 out of 10 with activity. Patient has night pain, worsening of pain with activity and weight bearing, pain that interferes with activities of daily living and crepitus.  Patient has evidence of periarticular osteophytes and joint space narrowing by imaging studies.  There is no active infection.  Patient Active Problem List   Diagnosis Date Noted  . Osteoarthritis of right knee 07/25/2018  . Erectile dysfunction 12/25/2016  . Precordial chest pain 05/22/2014  . Coronary artery disease involving native coronary artery of native heart without angina pectoris 01/30/2014  . Hypokalemia 01/30/2014  . Hyperlipidemia LDL goal <70 01/30/2014  . Fever 01/30/2014  . History of non-ST elevation myocardial infarction (NSTEMI) 01/28/2014  . Essential hypertension 01/28/2014   Past Medical History:  Diagnosis Date  . CAD (coronary artery disease)    a. NSTEMI 01/2014 s/p DES-100% distal LCx  . GERD (gastroesophageal reflux disease)   . Hyperlipidemia   . Hypertension   . Hypokalemia   . NSTEMI (non-ST elevated myocardial infarction) ([place])   . Sarcoidosis of central nervous system     Past Surgical History:  Procedure Laterality Date  . KNEE SURGERY Right    reconstructive  . LEFT HEART CATHETERIZATION WITH  CORONARY ANGIOGRAM N/A 01/28/2014   Procedure: LEFT HEART CATHETERIZATION WITH CORONARY ANGIOGRAM;  Surgeon: Larey Dresser, MD;  Location: [facility] CATH LAB;  Service: Cardiovascular;  Laterality: N/A;  . PERCUTANEOUS CORONARY STENT INTERVENTION (PCI-S)     40% ostial ramus, 100% distal LCx s/p DES, minor luminal irregularities elsewhere; 60-65%, there was basal inferior severe hypokinesis  . PERCUTANEOUS STENT INTERVENTION  01/28/2014   Procedure: PERCUTANEOUS STENT INTERVENTION;  Surgeon: Larey Dresser, MD;  Location: [facility] CATH LAB;  Service: Cardiovascular;;    No current facility-administered medications for this encounter.    Current Outpatient Medications  Medication Sig Dispense Refill Last Dose  . aspirin EC 81 MG EC tablet Take 1 tablet (81 mg total) by mouth daily.     . hydrochlorothiazide (HYDRODIURIL) 25 MG tablet Take 1 tablet (25 mg total) by mouth daily. 15 tablet 0 07/27/2018 at Unknown time  . potassium chloride SA (K-DUR,KLOR-CON) 20 MEQ tablet Take 20 mEq by mouth every evening.   07/26/2018 at Unknown time  . predniSONE (DELTASONE) 5 MG tablet Take 20 mg by mouth daily with breakfast.   2 07/27/2018 at Unknown time  . tamsulosin (FLOMAX) 0.4 MG CAPS capsule Take 0.4 mg by mouth daily at 3 pm.    07/26/2018  . valsartan (DIOVAN) 320 MG tablet Take 320 mg by mouth daily.  12   . VIAGRA 100 MG tablet Take 100 mg by mouth daily as needed for erectile dysfunction.   3   . atorvastatin (LIPITOR) 80 MG tablet Take 1 tablet (80 mg total) by mouth daily at 6 PM. Please call and schedule an appt for further  refills, 1st attempt (Patient not taking: Reported on 07/17/2018) 30 tablet 0 Not Taking at Unknown time  . methylPREDNISolone (MEDROL DOSEPAK) 4 MG TBPK tablet [place] as directed (Patient not taking: Reported on 07/17/2018) 21 tablet 0 Not Taking at Unknown time  . sildenafil (REVATIO) 20 MG tablet TAKE THREE TABLETS BY MOUTH ONCE DAILY AS NEEDED FOR  ERECTILE  DYSFUNCTION (Patient not taking:  Reported on 07/17/2018) 21 tablet 3 Not Taking at Unknown time   Facility-Administered Medications Ordered in Other Encounters  Medication Dose Route Frequency Provider Last Rate Last Dose  . hydrALAZINE (APRESOLINE) injection    Anesthesia Intra-op Armistead, Courtney Heys, CRNA   5 mg at 07/28/18 0814   Allergies  Allergen Reactions  . Amlodipine Other (See Comments)    Double vision    Social History   Tobacco Use  . Smoking status: Never Smoker  . Smokeless tobacco: Never Used  Substance Use Topics  . Alcohol use: Yes    Comment: occ    Family History  Problem Relation Age of Onset  . Lung disease Mother   . Hypertension Mother   . Other Father 23       agent  . Hypertension Father   . Coronary artery disease Cousin        multiple  . Coronary artery disease Other        aunts and uncles  . Stroke Sister 42  . Stroke Maternal Grandmother      Review of Systems  Constitutional: Negative.   HENT: Negative.   Eyes: Negative.   Respiratory: Negative.   Cardiovascular: Negative.   Gastrointestinal: Negative.   Genitourinary: Negative.   Musculoskeletal: Positive for joint pain.  Skin: Negative.   Neurological: Negative.   Endo/Heme/Allergies: Negative.   Psychiatric/Behavioral: Negative.     Objective:  Physical Exam  Constitutional: He is oriented to person, place, and time. He appears well-developed and well-nourished.  HENT:  Head: Normocephalic and atraumatic.  Eyes: Pupils are equal, round, and reactive to light.  Neck: Normal range of motion. Neck supple.  Cardiovascular: Intact distal pulses.  Respiratory: Effort normal.  Musculoskeletal: He exhibits tenderness.  No erythema or warmth.  Mild swelling in right knee.  Tenderness palpation along the medial joint line.  Patient with full range of motion both extension and flexion.  Patient with pain with range of motion.  Crepitus noted on range of motion.  Strength 5 out of 5 throughout.  Negative Lachman,  posterior drawer, valgus/varus testing, McMurray testing.  Neurological: He is alert and oriented to person, place, and time.  Psychiatric: He has a normal mood and affect. His behavior is normal. Judgment and thought content normal.    Vital signs in last 24 hours: Temp:  [98.7 F (37.1 C)] 98.7 F (37.1 C) (10/24 1547) Pulse Rate:  [87] 87 (10/24 1547) Resp:  [18] 18 (10/24 1547) BP: (145)/(93) 145/93 (10/24 1547) SpO2:  [98 %] 98 % (10/24 1547) Weight:  [92.4 kg] 92.4 kg (10/24 1547)  Labs:   Estimated body mass index is 30.96 kg/m as calculated from the following:   Height as of 09/11/18: 5\' 8"  (1.727 m).   Weight as of 09/11/18: 92.4 kg.   Imaging Review Plain radiographs demonstrate AP, Rosenberg, lateral and sunrise x-rays show bone-on-bone lateral compartment arthritis on Pence view lateral subluxation of the tibia on the Pierpoint view as well.  Cannulated ACL screws are present.  Peripheral osteophytes and subchondral cysts medially and to a greater extent laterally.  Preoperative templating of the joint replacement has been completed, documented, and submitted to the Operating Room personnel in order to optimize intra-operative equipment management.    Patient's anticipated LOS is less than 2 midnights, meeting these requirements: - Younger than 21 - Lives within 1 hour of care - Has a competent adult at home to recover with post-op recover - NO history of  - Chronic pain requiring opiods  - Diabetes  - Coronary Artery Disease  - Heart failure  - Heart attack  - Stroke  - DVT/VTE  - Cardiac arrhythmia  - Respiratory Failure/COPD  - Renal failure  - Anemia  - Advanced Liver disease        Assessment/Plan:  End stage arthritis, right knee   The patient history, physical examination, clinical judgment of the provider and imaging studies are consistent with end stage degenerative joint disease of the right knee(s) and total knee arthroplasty is  deemed medically necessary. The treatment options including medical management, injection therapy arthroscopy and arthroplasty were discussed at length. The risks and benefits of total knee arthroplasty were presented and reviewed. The risks due to aseptic loosening, infection, stiffness, patella tracking problems, thromboembolic complications and other imponderables were discussed. The patient acknowledged the explanation, agreed to proceed with the plan and consent was signed. Patient is being admitted for inpatient treatment for surgery, pain control, PT, OT, prophylactic antibiotics, VTE prophylaxis, progressive ambulation and ADL's and discharge planning. The patient is planning to be discharged home with home health services

## 2018-09-14 MED ORDER — TRANEXAMIC ACID 1000 MG/10ML IV SOLN
2000.0000 mg | INTRAVENOUS | Status: DC
  Filled 2018-09-14: qty 20

## 2018-09-14 MED ORDER — BUPIVACAINE LIPOSOME 1.3 % IJ SUSP
20.0000 mL | Freq: Once | INTRAMUSCULAR | Status: DC
  Filled 2018-09-14: qty 20

## 2018-09-15 ENCOUNTER — Observation Stay (HOSPITAL_COMMUNITY)
Admission: RE | Admit: 2018-09-15 | Discharge: 2018-09-16 | Disposition: A | Discharge status: Home / Self Care | Payer: BLUE CROSS/BLUE SHIELD | Source: Ambulatory Visit | Attending: Orthopedic Surgery | Admitting: Orthopedic Surgery

## 2018-09-15 ENCOUNTER — Other Ambulatory Visit: Payer: Self-pay

## 2018-09-15 ENCOUNTER — Ambulatory Visit (HOSPITAL_COMMUNITY): Payer: BLUE CROSS/BLUE SHIELD | Admitting: Anesthesiology

## 2018-09-15 ENCOUNTER — Encounter (HOSPITAL_COMMUNITY)
Admission: RE | Disposition: A | Discharge status: Home / Self Care | Payer: Self-pay | Source: Ambulatory Visit | Attending: Orthopedic Surgery

## 2018-09-15 ENCOUNTER — Encounter (HOSPITAL_COMMUNITY): Payer: Self-pay | Admitting: *Deleted

## 2018-09-15 DIAGNOSIS — Z836 Family history of other diseases of the respiratory system: Secondary | ICD-10-CM | POA: Diagnosis not present

## 2018-09-15 DIAGNOSIS — I10 Essential (primary) hypertension: Secondary | ICD-10-CM | POA: Insufficient documentation

## 2018-09-15 DIAGNOSIS — I2510 Atherosclerotic heart disease of native coronary artery without angina pectoris: Secondary | ICD-10-CM | POA: Insufficient documentation

## 2018-09-15 DIAGNOSIS — Z96651 Presence of right artificial knee joint: Secondary | ICD-10-CM | POA: Diagnosis not present

## 2018-09-15 DIAGNOSIS — Z888 Allergy status to other drugs, medicaments and biological substances status: Secondary | ICD-10-CM | POA: Insufficient documentation

## 2018-09-15 DIAGNOSIS — R072 Precordial pain: Secondary | ICD-10-CM | POA: Diagnosis not present

## 2018-09-15 DIAGNOSIS — Z823 Family history of stroke: Secondary | ICD-10-CM | POA: Diagnosis not present

## 2018-09-15 DIAGNOSIS — G8918 Other acute postprocedural pain: Secondary | ICD-10-CM | POA: Diagnosis not present

## 2018-09-15 DIAGNOSIS — Z8249 Family history of ischemic heart disease and other diseases of the circulatory system: Secondary | ICD-10-CM | POA: Diagnosis not present

## 2018-09-15 DIAGNOSIS — K219 Gastro-esophageal reflux disease without esophagitis: Secondary | ICD-10-CM | POA: Insufficient documentation

## 2018-09-15 DIAGNOSIS — M1711 Unilateral primary osteoarthritis, right knee: Secondary | ICD-10-CM | POA: Diagnosis present

## 2018-09-15 DIAGNOSIS — Z79899 Other long term (current) drug therapy: Secondary | ICD-10-CM | POA: Diagnosis not present

## 2018-09-15 DIAGNOSIS — Z955 Presence of coronary angioplasty implant and graft: Secondary | ICD-10-CM | POA: Insufficient documentation

## 2018-09-15 DIAGNOSIS — E876 Hypokalemia: Secondary | ICD-10-CM | POA: Diagnosis not present

## 2018-09-15 DIAGNOSIS — Z7982 Long term (current) use of aspirin: Secondary | ICD-10-CM | POA: Insufficient documentation

## 2018-09-15 DIAGNOSIS — E785 Hyperlipidemia, unspecified: Secondary | ICD-10-CM | POA: Insufficient documentation

## 2018-09-15 DIAGNOSIS — I252 Old myocardial infarction: Secondary | ICD-10-CM | POA: Diagnosis not present

## 2018-09-15 DIAGNOSIS — D869 Sarcoidosis, unspecified: Secondary | ICD-10-CM | POA: Insufficient documentation

## 2018-09-15 HISTORY — PX: TOTAL KNEE ARTHROPLASTY: SHX125

## 2018-09-15 SURGERY — ARTHROPLASTY, KNEE, TOTAL
Anesthesia: Spinal | Site: Knee | Laterality: Right

## 2018-09-15 MED ORDER — TRANEXAMIC ACID-NACL 1000-0.7 MG/100ML-% IV SOLN
1000.0000 mg | Freq: Once | INTRAVENOUS | Status: AC
  Administered 2018-09-15: 1000 mg via INTRAVENOUS
  Filled 2018-09-15: qty 100

## 2018-09-15 MED ORDER — ROPIVACAINE HCL 7.5 MG/ML IJ SOLN
INTRAMUSCULAR | Status: DC | PRN
  Administered 2018-09-15: 20 mL via PERINEURAL

## 2018-09-15 MED ORDER — PHENYLEPHRINE 40 MCG/ML (10ML) SYRINGE FOR IV PUSH (FOR BLOOD PRESSURE SUPPORT)
PREFILLED_SYRINGE | INTRAVENOUS | Status: AC
  Filled 2018-09-15: qty 10

## 2018-09-15 MED ORDER — CELECOXIB 200 MG PO CAPS
200.0000 mg | ORAL_CAPSULE | Freq: Two times a day (BID) | ORAL | Status: DC
  Administered 2018-09-15 – 2018-09-16 (×2): 200 mg via ORAL
  Filled 2018-09-15 (×2): qty 1

## 2018-09-15 MED ORDER — PANTOPRAZOLE SODIUM 40 MG PO TBEC
40.0000 mg | DELAYED_RELEASE_TABLET | Freq: Every day | ORAL | Status: DC
  Administered 2018-09-15 – 2018-09-16 (×2): 40 mg via ORAL
  Filled 2018-09-15 (×2): qty 1

## 2018-09-15 MED ORDER — HYDROMORPHONE HCL 1 MG/ML IJ SOLN: 0.5000 mg | INTRAMUSCULAR | Status: DC | PRN

## 2018-09-15 MED ORDER — PROPOFOL 10 MG/ML IV BOLUS
INTRAVENOUS | Status: DC | PRN
  Administered 2018-09-15: 30 mg via INTRAVENOUS
  Administered 2018-09-15: 20 mg via INTRAVENOUS

## 2018-09-15 MED ORDER — OXYCODONE HCL 5 MG PO TABS: 5.0000 mg | ORAL_TABLET | Freq: Once | ORAL | Status: DC | PRN

## 2018-09-15 MED ORDER — SODIUM CHLORIDE 0.9 % IV SOLN
INTRAVENOUS | Status: DC | PRN
  Administered 2018-09-15: 50 ug/min via INTRAVENOUS

## 2018-09-15 MED ORDER — PROPOFOL 10 MG/ML IV BOLUS
INTRAVENOUS | Status: AC
  Filled 2018-09-15: qty 20

## 2018-09-15 MED ORDER — PROMETHAZINE HCL 25 MG/ML IJ SOLN: 6.2500 mg | INTRAMUSCULAR | Status: DC | PRN

## 2018-09-15 MED ORDER — METOCLOPRAMIDE HCL 5 MG PO TABS: 5.0000 mg | ORAL_TABLET | Freq: Three times a day (TID) | ORAL | Status: DC | PRN

## 2018-09-15 MED ORDER — PHENOL 1.4 % MT LIQD: 1.0000 | OROMUCOSAL | Status: DC | PRN

## 2018-09-15 MED ORDER — ASPIRIN 81 MG PO CHEW
81.0000 mg | CHEWABLE_TABLET | Freq: Two times a day (BID) | ORAL | Status: DC
  Administered 2018-09-15 – 2018-09-16 (×2): 81 mg via ORAL
  Filled 2018-09-15 (×2): qty 1

## 2018-09-15 MED ORDER — HYDROCHLOROTHIAZIDE 25 MG PO TABS
25.0000 mg | ORAL_TABLET | Freq: Every day | ORAL | Status: DC
  Administered 2018-09-16: 25 mg via ORAL
  Filled 2018-09-15: qty 1

## 2018-09-15 MED ORDER — CHLORHEXIDINE GLUCONATE 4 % EX LIQD: 60.0000 mL | Freq: Once | CUTANEOUS | Status: DC

## 2018-09-15 MED ORDER — PROPOFOL 10 MG/ML IV BOLUS
INTRAVENOUS | Status: AC
  Filled 2018-09-15: qty 60

## 2018-09-15 MED ORDER — ALUM & MAG HYDROXIDE-SIMETH 200-200-20 MG/5ML PO SUSP: 30.0000 mL | ORAL | Status: DC | PRN

## 2018-09-15 MED ORDER — OXYCODONE HCL 5 MG PO TABS
5.0000 mg | ORAL_TABLET | ORAL | Status: DC | PRN
  Administered 2018-09-15 – 2018-09-16 (×2): 5 mg via ORAL
  Filled 2018-09-15: qty 2
  Filled 2018-09-15: qty 1

## 2018-09-15 MED ORDER — PROPOFOL 500 MG/50ML IV EMUL
INTRAVENOUS | Status: DC | PRN
  Administered 2018-09-15: 100 ug/kg/min via INTRAVENOUS

## 2018-09-15 MED ORDER — METOCLOPRAMIDE HCL 5 MG/ML IJ SOLN: 5.0000 mg | Freq: Three times a day (TID) | INTRAMUSCULAR | Status: DC | PRN

## 2018-09-15 MED ORDER — TAMSULOSIN HCL 0.4 MG PO CAPS
0.4000 mg | ORAL_CAPSULE | Freq: Every day | ORAL | Status: DC
  Administered 2018-09-15: 0.4 mg via ORAL
  Filled 2018-09-15: qty 1

## 2018-09-15 MED ORDER — PHENYLEPHRINE 40 MCG/ML (10ML) SYRINGE FOR IV PUSH (FOR BLOOD PRESSURE SUPPORT)
PREFILLED_SYRINGE | INTRAVENOUS | Status: DC | PRN
  Administered 2018-09-15 (×6): 80 ug via INTRAVENOUS

## 2018-09-15 MED ORDER — ACETAMINOPHEN 325 MG PO TABS: 325.0000 mg | ORAL_TABLET | Freq: Four times a day (QID) | ORAL | Status: DC | PRN

## 2018-09-15 MED ORDER — DIPHENHYDRAMINE HCL 12.5 MG/5ML PO ELIX: 12.5000 mg | ORAL_SOLUTION | ORAL | Status: DC | PRN

## 2018-09-15 MED ORDER — DEXAMETHASONE SODIUM PHOSPHATE 10 MG/ML IJ SOLN
INTRAMUSCULAR | Status: DC | PRN
  Administered 2018-09-15: 10 mg via INTRAVENOUS

## 2018-09-15 MED ORDER — POTASSIUM CHLORIDE CRYS ER 20 MEQ PO TBCR
20.0000 meq | EXTENDED_RELEASE_TABLET | Freq: Every evening | ORAL | Status: DC
  Administered 2018-09-15: 20 meq via ORAL
  Filled 2018-09-15: qty 1

## 2018-09-15 MED ORDER — BUPIVACAINE IN DEXTROSE 0.75-8.25 % IT SOLN
INTRATHECAL | Status: DC | PRN
  Administered 2018-09-15: 2 mL via INTRATHECAL

## 2018-09-15 MED ORDER — PHENYLEPHRINE HCL 10 MG/ML IJ SOLN
INTRAMUSCULAR | Status: AC
  Filled 2018-09-15: qty 1

## 2018-09-15 MED ORDER — WATER FOR IRRIGATION, STERILE IR SOLN
Status: DC | PRN
  Administered 2018-09-15: 3000 mL

## 2018-09-15 MED ORDER — OXYCODONE HCL 5 MG/5ML PO SOLN
5.0000 mg | Freq: Once | ORAL | Status: DC | PRN
  Filled 2018-09-15: qty 5

## 2018-09-15 MED ORDER — ONDANSETRON HCL 4 MG/2ML IJ SOLN
INTRAMUSCULAR | Status: AC
  Filled 2018-09-15: qty 2

## 2018-09-15 MED ORDER — KCL IN DEXTROSE-NACL 20-5-0.45 MEQ/L-%-% IV SOLN
INTRAVENOUS | Status: DC
  Administered 2018-09-16: 03:00:00 via INTRAVENOUS
  Filled 2018-09-15 (×2): qty 1000

## 2018-09-15 MED ORDER — LIDOCAINE HCL (CARDIAC) PF 100 MG/5ML IV SOSY
PREFILLED_SYRINGE | INTRAVENOUS | Status: DC | PRN
  Administered 2018-09-15: 60 mg via INTRAVENOUS

## 2018-09-15 MED ORDER — LACTATED RINGERS IV SOLN
INTRAVENOUS | Status: DC
  Administered 2018-09-15 (×2): via INTRAVENOUS

## 2018-09-15 MED ORDER — DEXAMETHASONE SODIUM PHOSPHATE 10 MG/ML IJ SOLN
INTRAMUSCULAR | Status: AC
  Filled 2018-09-15: qty 1

## 2018-09-15 MED ORDER — SODIUM CHLORIDE 0.9 % IR SOLN
Status: DC | PRN
  Administered 2018-09-15: 1000 mL

## 2018-09-15 MED ORDER — BUPIVACAINE HCL (PF) 0.25 % IJ SOLN
INTRAMUSCULAR | Status: DC | PRN
  Administered 2018-09-15: 30 mL

## 2018-09-15 MED ORDER — ONDANSETRON HCL 4 MG/2ML IJ SOLN
INTRAMUSCULAR | Status: DC | PRN
  Administered 2018-09-15: 4 mg via INTRAVENOUS

## 2018-09-15 MED ORDER — BUPIVACAINE LIPOSOME 1.3 % IJ SUSP: 20.0000 mL | Freq: Once | INTRAMUSCULAR | Status: DC

## 2018-09-15 MED ORDER — MIDAZOLAM HCL 2 MG/2ML IJ SOLN
1.0000 mg | Freq: Once | INTRAMUSCULAR | Status: AC
  Administered 2018-09-15: 1 mg via INTRAVENOUS
  Filled 2018-09-15: qty 2

## 2018-09-15 MED ORDER — LIDOCAINE 2% (20 MG/ML) 5 ML SYRINGE
INTRAMUSCULAR | Status: AC
  Filled 2018-09-15: qty 5

## 2018-09-15 MED ORDER — PREDNISONE 20 MG PO TABS
20.0000 mg | ORAL_TABLET | Freq: Every day | ORAL | Status: DC
  Administered 2018-09-16: 20 mg via ORAL
  Filled 2018-09-15: qty 1

## 2018-09-15 MED ORDER — METHOCARBAMOL 500 MG PO TABS: 500.0000 mg | ORAL_TABLET | Freq: Four times a day (QID) | ORAL | Status: DC | PRN

## 2018-09-15 MED ORDER — DOCUSATE SODIUM 100 MG PO CAPS
100.0000 mg | ORAL_CAPSULE | Freq: Two times a day (BID) | ORAL | Status: DC
  Administered 2018-09-15 – 2018-09-16 (×2): 100 mg via ORAL
  Filled 2018-09-15 (×2): qty 1

## 2018-09-15 MED ORDER — TRANEXAMIC ACID-NACL 1000-0.7 MG/100ML-% IV SOLN: 1000.0000 mg | INTRAVENOUS | Status: DC

## 2018-09-15 MED ORDER — HYDROMORPHONE HCL 1 MG/ML IJ SOLN: 0.2500 mg | INTRAMUSCULAR | Status: DC | PRN

## 2018-09-15 MED ORDER — SODIUM CHLORIDE 0.9 % IJ SOLN
INTRAMUSCULAR | Status: AC
  Filled 2018-09-15: qty 50

## 2018-09-15 MED ORDER — FLEET ENEMA 7-19 GM/118ML RE ENEM: 1.0000 | ENEMA | Freq: Once | RECTAL | Status: DC | PRN

## 2018-09-15 MED ORDER — CLONIDINE HCL (ANALGESIA) 100 MCG/ML EP SOLN
EPIDURAL | Status: DC | PRN
  Administered 2018-09-15: 50 ug

## 2018-09-15 MED ORDER — ONDANSETRON HCL 4 MG PO TABS: 4.0000 mg | ORAL_TABLET | Freq: Four times a day (QID) | ORAL | Status: DC | PRN

## 2018-09-15 MED ORDER — BISACODYL 5 MG PO TBEC: 5.0000 mg | DELAYED_RELEASE_TABLET | Freq: Every day | ORAL | Status: DC | PRN

## 2018-09-15 MED ORDER — CEFAZOLIN SODIUM-DEXTROSE 2-4 GM/100ML-% IV SOLN
2.0000 g | INTRAVENOUS | Status: AC
  Administered 2018-09-15: 2 g via INTRAVENOUS
  Filled 2018-09-15: qty 100

## 2018-09-15 MED ORDER — BUPIVACAINE HCL (PF) 0.25 % IJ SOLN
INTRAMUSCULAR | Status: AC
  Filled 2018-09-15: qty 30

## 2018-09-15 MED ORDER — FENTANYL CITRATE (PF) 100 MCG/2ML IJ SOLN
50.0000 ug | Freq: Once | INTRAMUSCULAR | Status: AC
  Administered 2018-09-15: 50 ug via INTRAVENOUS
  Filled 2018-09-15: qty 2

## 2018-09-15 MED ORDER — LACTATED RINGERS IV SOLN: INTRAVENOUS | Status: DC

## 2018-09-15 MED ORDER — IRBESARTAN 150 MG PO TABS
300.0000 mg | ORAL_TABLET | Freq: Every day | ORAL | Status: DC
  Administered 2018-09-15 – 2018-09-16 (×2): 300 mg via ORAL
  Filled 2018-09-15 (×2): qty 2

## 2018-09-15 MED ORDER — SODIUM CHLORIDE 0.9 % IJ SOLN
INTRAMUSCULAR | Status: DC | PRN
  Administered 2018-09-15: 50 mL

## 2018-09-15 MED ORDER — POLYETHYLENE GLYCOL 3350 17 G PO PACK: 17.0000 g | PACK | Freq: Every day | ORAL | Status: DC | PRN

## 2018-09-15 MED ORDER — METHOCARBAMOL 500 MG IVPB - SIMPLE MED
500.0000 mg | Freq: Four times a day (QID) | INTRAVENOUS | Status: DC | PRN
  Filled 2018-09-15: qty 50

## 2018-09-15 MED ORDER — MENTHOL 3 MG MT LOZG: 1.0000 | LOZENGE | OROMUCOSAL | Status: DC | PRN

## 2018-09-15 MED ORDER — ONDANSETRON HCL 4 MG/2ML IJ SOLN: 4.0000 mg | Freq: Four times a day (QID) | INTRAMUSCULAR | Status: DC | PRN

## 2018-09-15 MED ORDER — TRANEXAMIC ACID-NACL 1000-0.7 MG/100ML-% IV SOLN
1000.0000 mg | INTRAVENOUS | Status: AC
  Administered 2018-09-15: 1000 mg via INTRAVENOUS
  Filled 2018-09-15 (×2): qty 100

## 2018-09-15 MED ORDER — GABAPENTIN 300 MG PO CAPS
300.0000 mg | ORAL_CAPSULE | Freq: Three times a day (TID) | ORAL | Status: DC
  Administered 2018-09-15 – 2018-09-16 (×3): 300 mg via ORAL
  Filled 2018-09-15 (×3): qty 1

## 2018-09-15 SURGICAL SUPPLY — 49 items
ATTUNE MED DOME PAT 41 KNEE (Knees) ×2 IMPLANT
ATTUNE PS FEM RT SZ 6 CEM KNEE (Femur) ×2 IMPLANT
ATTUNE PSRP INSR SZ6 6 KNEE (Insert) ×2 IMPLANT
BAG DECANTER FOR FLEXI CONT (MISCELLANEOUS) ×2 IMPLANT
BAG ZIPLOCK 12X15 (MISCELLANEOUS) ×2 IMPLANT
BASE TIBIAL ROT PLAT SZ 8 KNEE (Knees) ×1 IMPLANT
BLADE SAG 18X100X1.27 (BLADE) ×2 IMPLANT
BLADE SAW SGTL 11.0X1.19X90.0M (BLADE) IMPLANT
BNDG ELASTIC 6X10 VLCR STRL LF (GAUZE/BANDAGES/DRESSINGS) ×2 IMPLANT
BOWL SMART MIX CTS (DISPOSABLE) ×2 IMPLANT
CEMENT HV SMART SET (Cement) ×2 IMPLANT
COVER SURGICAL LIGHT HANDLE (MISCELLANEOUS) ×2 IMPLANT
COVER WAND RF STERILE (DRAPES) IMPLANT
CUFF TOURN SGL QUICK 34 (TOURNIQUET CUFF) ×1
CUFF TRNQT CYL 34X4X40X1 (TOURNIQUET CUFF) ×1 IMPLANT
DECANTER SPIKE VIAL GLASS SM (MISCELLANEOUS) ×6 IMPLANT
DRAPE U-SHAPE 47X51 STRL (DRAPES) ×2 IMPLANT
DRSG AQUACEL AG ADV 3.5X10 (GAUZE/BANDAGES/DRESSINGS) ×2 IMPLANT
DURAPREP 26ML APPLICATOR (WOUND CARE) ×2 IMPLANT
ELECT REM PT RETURN 15FT ADLT (MISCELLANEOUS) ×2 IMPLANT
GLOVE BIO SURGEON STRL SZ7.5 (GLOVE) ×2 IMPLANT
GLOVE BIO SURGEON STRL SZ8.5 (GLOVE) ×2 IMPLANT
GLOVE BIOGEL PI IND STRL 8 (GLOVE) ×1 IMPLANT
GLOVE BIOGEL PI IND STRL 9 (GLOVE) ×1 IMPLANT
GLOVE BIOGEL PI INDICATOR 8 (GLOVE) ×1
GLOVE BIOGEL PI INDICATOR 9 (GLOVE) ×1
GOWN STRL REUS W/TWL XL LVL3 (GOWN DISPOSABLE) ×4 IMPLANT
HANDPIECE INTERPULSE COAX TIP (DISPOSABLE) ×1
HOOD PEEL AWAY FLYTE STAYCOOL (MISCELLANEOUS) ×6 IMPLANT
IV KIT MINILOC 20X1 SAFETY (NEEDLE) ×2 IMPLANT
NS IRRIG 1000ML POUR BTL (IV SOLUTION) ×2 IMPLANT
PACK ICE MAXI GEL EZY WRAP (MISCELLANEOUS) ×2 IMPLANT
PACK TOTAL KNEE CUSTOM (KITS) ×2 IMPLANT
PIN STEINMAN FIXATION KNEE (PIN) ×2 IMPLANT
PIN THREADED HEADED SIGMA (PIN) ×2 IMPLANT
POSITIONER SURGICAL ARM (MISCELLANEOUS) ×2 IMPLANT
SET HNDPC FAN SPRY TIP SCT (DISPOSABLE) ×1 IMPLANT
STAPLER VISISTAT 35W (STAPLE) IMPLANT
SUT VIC AB 1 CTX 36 (SUTURE) ×1
SUT VIC AB 1 CTX36XBRD ANBCTR (SUTURE) ×1 IMPLANT
SUT VIC AB 2-0 CT1 27 (SUTURE) ×1
SUT VIC AB 2-0 CT1 TAPERPNT 27 (SUTURE) ×1 IMPLANT
SUT VIC AB 3-0 CT1 27 (SUTURE) ×1
SUT VIC AB 3-0 CT1 TAPERPNT 27 (SUTURE) ×1 IMPLANT
SYR CONTROL 10ML LL (SYRINGE) ×4 IMPLANT
TIBIAL BASE ROT PLAT SZ 8 KNEE (Knees) ×2 IMPLANT
TRAY FOLEY MTR SLVR 16FR STAT (SET/KITS/TRAYS/PACK) ×2 IMPLANT
WATER STERILE IRR 1000ML POUR (IV SOLUTION) ×4 IMPLANT
YANKAUER SUCT BULB TIP 10FT TU (MISCELLANEOUS) ×2 IMPLANT

## 2018-09-15 NOTE — Op Note (Addendum)
PATIENT ID:      Bryan Torres  MRN:     [id number] DOB/AGE:    1961/08/31 / 57 y.o.       OPERATIVE REPORT    DATE OF PROCEDURE:  09/15/2018       PREOPERATIVE DIAGNOSIS:   RIGHT KNEE OSTEOARTHRITIS      Estimated body mass index is 30.96 kg/m as calculated from the following:   Height as of this encounter: 5\' 8"  (1.727 m).   Weight as of this encounter: 92.4 kg.                                                        POSTOPERATIVE DIAGNOSIS:   RIGHT KNEE OSTEOARTHRITIS                                                                      PROCEDURE:  Procedure(s): RIGHT TOTAL KNEE ARTHROPLASTY, REMOVE ACL SCREWS Using DepuyAttune RP implants #6R Femur, #8Tibia, 6 mm Attune RP bearing, 41 Patella     SURGEON: Kerin Salen    ASSISTANT:   RNFA   (Present and scrubbed throughout the case, critical for assistance with exposure, retraction, instrumentation, and closure.)         ANESTHESIA: Spinal, 20cc Exparel, 50cc 0.25% Marcaine  EBL: 350cc  FLUID REPLACEMENT: 1600cc  Tourniquet Time: None  Drains: None  Tranexamic Acid: 1gm IV, 2gm topical  Exparel: 266mg    COMPLICATIONS:  None         INDICATIONS FOR PROCEDURE: The patient has  RIGHT KNEE OSTEOARTHRITIS, Val deformities, XR shows bone on bone arthritis, lateral subluxation of tibia. Patient has failed all conservative measures including anti-inflammatory medicines, narcotics, attempts at  exercise and weight loss, cortisone injections and viscosupplementation.  Risks and benefits of surgery have been discussed, questions answered.   DESCRIPTION OF PROCEDURE: The patient identified by armband, received  IV antibiotics, in the holding area at [facility]. Patient taken to the operating room, appropriate anesthetic  monitors were attached, and Spinal anesthesia was  induced. Tourniquet  applied high to the operative thigh. Lateral post and foot positioner  applied to the table, the lower extremity was then prepped and  draped  in usual sterile fashion from the toes to the tourniquet. Time-out procedure was performed. The skin and subcutaneous tissue along the incision was injected with 20 cc of a mixture of Exparel and Marcaine solution, using a 20-gauge by 1-1/2 inch needle. We began the operation, with the knee flexed 130 degrees, by making the anterior midline incision starting at handbreadth above the patella going over the patella 1 cm medial to and 4 cm distal to the tibial tubercle. Small bleeders in the skin and the  subcutaneous tissue identified and cauterized. Transverse retinaculum was incised and reflected medially and a medial parapatellar arthrotomy was accomplished. the patella was everted and theprepatellar fat pad resected. The superficial medial collateral  ligament was then elevated from anterior to posterior along the proximal  flare of the tibia and anterior half of the menisci resected. The tibial ACL screw  was exposed and removed with a 4.5 screwdriver. The knee was hyperflexed exposing bone on bone arthritis. Peripheral and notch osteophytes as well as the cruciate ligaments were then resected. We continued to  work our way around posteriorly along the proximal tibia, and externally  rotated the tibia subluxing it out from underneath the femur. A McHale  retractor was placed through the notch and a lateral Hohmann retractor  placed, and we then drilled through the proximal tibia in line with the  axis of the tibia followed by an intramedullary guide rod and 2-degree  posterior slope cutting guide. The tibial cutting guide, 3 degree posterior sloped, was pinned into place allowing resection of 6 mm of bone medially and 0 mm of bone laterally. Satisfied with the tibial resection, we then  entered the distal femur 2 mm anterior to the PCL origin with the  intramedullary guide rod and applied the distal femoral cutting guide  set at 9 mm, with 5 degrees of valgus. This was pinned along the   epicondylar axis. At this point, the distal femoral cut was accomplished without difficulty. We then sized for a #6R femoral component and pinned the guide in 0 degrees of external rotation. The chamfer cutting guide was pinned into place. The anterior, posterior, and chamfer cuts were accomplished without difficulty followed by  the Attune RP box cutting guide and the box cut. The second ACL screw was removed duriing the box cut. We also removed posterior osteophytes from the posterior femoral condyles. At this  time, the knee was brought into full extension. We checked our  extension and flexion gaps and found them symmetric for a 6 mm bearing. Distracting in extension with a lamina spreader, the posterior horns of the menisci were removed, and Exparel, diluted to 60 cc, with 20cc NS, and 20cc 0.5% Marcaine,was injected into the capsule and synovium of the knee. The posterior patella cut was accomplished with the 9.5 mm Attune cutting guide, sized for a 62mm dome, and the fixation pegs drilled.The knee  was then once again hyperflexed exposing the proximal tibia. We sized for a # 8 tibial base plate, applied the smokestack and the conical reamer followed by the the Delta fin keel punch. We then hammered into place the Attune RP trial femoral component, drilled the lugs, inserted a  6 mm trial bearing, trial patellar button, and took the knee through range of motion from 0-130 degrees. No thumb pressure was required for patellar Tracking. At this point, the limb was wrapped with an Esmarch bandage and the tourniquet inflated to 350 mmHg. All trial components were removed, mating surfaces irrigated with pulse lavage, and dried with suction and sponges. 10 cc of the Exparel solution was applied to the cancellus bone of the patella distal femur and proximal tibia.  After waiting 1 minute, the bony surfaces were again, dried with sponges. A double batch of DePuy HV cement with 1500 mg of Zinacef was mixed and  applied to all bony metallic mating surfaces except for the posterior condyles of the femur itself. In order, we hammered into place the tibial tray and removed excess cement, the femoral component and removed excess cement. The final Attune RP bearing  was inserted, and the knee brought to full extension with compression.  The patellar button was clamped into place, and excess cement  removed. While the cement cured the wound was irrigated out with normal saline solution pulse lavage. Ligament stability and patellar tracking were checked and found to  be excellent. The parapatellar arthrotomy was closed with  running #1 Vicryl suture. The subcutaneous tissue with 0 and 2-0 undyed  Vicryl suture, and the skin with running 3-0 SQ vicryl. A dressing of Xeroform,  4 x 4, dressing sponges, Webril, and Ace wrap applied. The patient  awakened, and taken to recovery room without difficulty.   Kerin Salen 09/15/2018, 2:12 PM

## 2018-09-15 NOTE — Anesthesia Procedure Notes (Signed)
Spinal  Patient location during procedure: OR Start time: 09/15/2018 12:11 PM End time: 09/15/2018 12:18 PM Staffing Anesthesiologist: Myrtie Soman, MD Performed: anesthesiologist  Preanesthetic Checklist Completed: patient identified, site marked, surgical consent, pre-op evaluation, timeout performed, IV checked, risks and benefits discussed and monitors and equipment checked Spinal Block Patient position: sitting Prep: ChloraPrep Patient monitoring: heart rate, continuous pulse ox and blood pressure Location: L4-5 Injection technique: single-shot Needle Needle type: Sprotte  Needle gauge: 24 G Needle length: 9 cm Additional Notes Expiration date of kit checked and confirmed. Patient tolerated procedure well, without complications.

## 2018-09-15 NOTE — Evaluation (Signed)
Physical Therapy Evaluation Patient Details Name: Bryan Torres MRN: [id number] DOB: 1961/07/07 Today's Date: 09/15/2018   History of Present Illness  R TKA, H/OSTEMI  Clinical Impression  Patient found with seeping blood from surgical site. RN in to reinforce. Did not bl;eed through with mobility.Patient ambulated x 120'/  Pt admitted with above diagnosis. Pt currently with functional limitations due to the deficits listed below (see PT Problem List).  Pt will benefit from skilled PT to increase their independence and safety with mobility to allow discharge to the venue listed below.       Follow Up Recommendations Follow surgeon's recommendation for DC plan and follow-up therapies;Home health PT    Equipment Recommendations  None recommended by PT    Recommendations for Other Services       Precautions / Restrictions Precautions Precautions: Knee Precaution Comments: bleeding from incision right leg Restrictions Weight Bearing Restrictions: No      Mobility  Bed Mobility Overal bed mobility: Needs Assistance Bed Mobility: Supine to Sit     Supine to sit: Min guard        Transfers Overall transfer level: Needs assistance Equipment used: Rolling walker (2 wheeled) Transfers: Sit to/from Stand Sit to Stand: Min assist         General transfer comment: cues for hand and right leg position  Ambulation/Gait Ambulation/Gait assistance: Min assist Gait Distance (Feet): 120 Feet Assistive device: Rolling walker (2 wheeled) Gait Pattern/deviations: Step-through pattern     General Gait Details: Left leg tending to swing inward.   Stairs            Wheelchair Mobility    Modified Rankin (Stroke Patients Only)       Balance                                             Pertinent Vitals/Pain Pain Assessment: 0-10 Pain Score: 4  Pain Location: right knee Pain Descriptors / Indicators: Discomfort Pain Intervention(s):  Monitored during session;Premedicated before session;Repositioned;Ice applied    Home Living Family/patient expects to be discharged to:: Private residence Living Arrangements: Spouse/significant other Available Help at Discharge: Family Type of Home: House Home Access: Stairs to enter Entrance Stairs-Rails: None Entrance Stairs-Number of Steps: 3 Home Layout: Two level;Bed/bath upstairs Home Equipment: [place] - 2 wheels;Cane - single point      Prior Function Level of Independence: Independent with assistive device(s)               Hand Dominance        Extremity/Trunk Assessment   Upper Extremity Assessment Upper Extremity Assessment: Overall WFL for tasks assessed    Lower Extremity Assessment Lower Extremity Assessment: RLE deficits/detail RLE Deficits / Details: + SLR, knee flexion to 50- limited due to bleeding       Communication   Communication: No difficulties  Cognition Arousal/Alertness: Awake/alert Behavior During Therapy: WFL for tasks assessed/performed Overall Cognitive Status: Within Functional Limits for tasks assessed                                        General Comments      Exercises     Assessment/Plan    PT Assessment Patient needs continued PT services  PT Problem List Decreased range of motion;Decreased strength;Decreased activity  tolerance;Decreased mobility;Decreased knowledge of precautions;Decreased safety awareness;Decreased knowledge of use of DME;Decreased cognition;Pain       PT Treatment Interventions DME instruction;Gait training;Functional mobility training;Stair training;Therapeutic activities;Patient/family education    PT Goals (Current goals can be found in the Care Plan section)  Acute Rehab PT Goals Patient Stated Goal: go home PT Goal Formulation: With patient/family Time For Goal Achievement: 09/19/18 Potential to Achieve Goals: Good    Frequency 7X/week   Barriers to discharge         Co-evaluation               AM-PAC PT "6 Clicks" Daily Activity  Outcome Measure Difficulty turning over in bed (including adjusting bedclothes, sheets and blankets)?: A Little Difficulty moving from lying on back to sitting on the side of the bed? : A Little Difficulty sitting down on and standing up from a chair with arms (e.g., wheelchair, bedside commode, etc,.)?: A Little Help needed moving to and from a bed to chair (including a wheelchair)?: A Little Help needed walking in hospital room?: A Lot Help needed climbing 3-5 steps with a railing? : Total 6 Click Score: 15    End of Session Equipment Utilized During Treatment: Gait belt Activity Tolerance: Patient tolerated treatment well Patient left: in chair;with call bell/phone within reach;with chair alarm set;with family/visitor present Nurse Communication: Mobility status PT Visit Diagnosis: Unsteadiness on feet (R26.81);Pain Pain - Right/Left: Right Pain - part of body: Knee    Time: 6415-8309 PT Time Calculation (min) (ACUTE ONLY): 36 min   Charges:   PT Evaluation $PT Eval Low Complexity: 1 Low PT Treatments $Gait Training: 8-22 mins        Tresa Endo PT Acute Rehabilitation Services Pager [phone] Office [phone]   Claretha Cooper 09/15/2018, 6:50 PM

## 2018-09-15 NOTE — Progress Notes (Signed)
Received report from Valora Piccolo from PACU. Patient to arrive in 20 minutes.

## 2018-09-15 NOTE — Anesthesia Procedure Notes (Signed)
Anesthesia Procedure Image    

## 2018-09-15 NOTE — Interval H&P Note (Signed)
History and Physical Interval Note:  09/15/2018 11:18 AM  Bryan Torres  has presented today for surgery, with the diagnosis of RIGHT KNEE OSTEOARTHRITIS  The various methods of treatment have been discussed with the patient and family. After consideration of risks, benefits and other options for treatment, the patient has consented to  Procedure(s): RIGHT TOTAL KNEE ARTHROPLASTY, REMOVE ACL SCREWS (Right) as a surgical intervention .  The patient's history has been reviewed, patient examined, no change in status, stable for surgery.  I have reviewed the patient's chart and labs.  Questions were answered to the patient's satisfaction.     Kerin Salen

## 2018-09-15 NOTE — Anesthesia Preprocedure Evaluation (Signed)
Anesthesia Evaluation  Patient identified by MRN, date of birth, ID band Patient awake    Reviewed: Allergy & Precautions, NPO status , Patient's Chart, lab work & pertinent test results  Airway Mallampati: II  TM Distance: >3 FB Neck ROM: Full    Dental no notable dental hx.    Pulmonary neg pulmonary ROS,    Pulmonary exam normal breath sounds clear to auscultation       Cardiovascular hypertension, + CAD, + Past MI and + Cardiac Stents  Normal cardiovascular exam Rhythm:Regular Rate:Normal     Neuro/Psych negative neurological ROS  negative psych ROS   GI/Hepatic negative GI ROS, Neg liver ROS,   Endo/Other  negative endocrine ROS  Renal/GU negative Renal ROS  negative genitourinary   Musculoskeletal negative musculoskeletal ROS (+)   Abdominal   Peds negative pediatric ROS (+)  Hematology negative hematology ROS (+)   Anesthesia Other Findings   Reproductive/Obstetrics negative OB ROS                             Anesthesia Physical Anesthesia Plan  ASA: III  Anesthesia Plan: Spinal   Post-op Pain Management:    Induction: Intravenous  PONV Risk Score and Plan: 1 and Ondansetron  Airway Management Planned: Simple Face Mask  Additional Equipment:   Intra-op Plan:   Post-operative Plan:   Informed Consent: I have reviewed the patients History and Physical, chart, labs and discussed the procedure including the risks, benefits and alternatives for the proposed anesthesia with the patient or authorized representative who has indicated his/her understanding and acceptance.   Dental advisory given  Plan Discussed with: CRNA and Surgeon  Anesthesia Plan Comments:         Anesthesia Quick Evaluation

## 2018-09-15 NOTE — Progress Notes (Signed)
Assisted Dr. Rose with right, ultrasound guided, adductor canal block. Side rails up, monitors on throughout procedure. See vital signs in flow sheet. Tolerated Procedure well.  

## 2018-09-15 NOTE — Anesthesia Postprocedure Evaluation (Signed)
Anesthesia Post Note  Patient: Bryan Torres  Procedure(s) Performed: RIGHT TOTAL KNEE ARTHROPLASTY, REMOVE ACL SCREWS (Right Knee)     Patient location during evaluation: PACU Anesthesia Type: Spinal Level of consciousness: oriented and awake and alert Pain management: pain level controlled Vital Signs Assessment: post-procedure vital signs reviewed and stable Respiratory status: spontaneous breathing, respiratory function stable and patient connected to nasal cannula oxygen Cardiovascular status: blood pressure returned to baseline and stable Postop Assessment: no headache, no backache and no apparent nausea or vomiting Anesthetic complications: no    Last Vitals:  Vitals:   09/15/18 1445 09/15/18 1500  BP: 117/88   Pulse: 71 70  Resp: (!) 22 20  Temp:  (!) 36.4 C  SpO2: 100% 99%    Last Pain:  Vitals:   09/15/18 1445  TempSrc:   PainSc: 0-No pain                 Khamauri Bauernfeind S

## 2018-09-15 NOTE — Transfer of Care (Signed)
Immediate Anesthesia Transfer of Care Note  Patient: Bryan Torres  Procedure(s) Performed: RIGHT TOTAL KNEE ARTHROPLASTY, REMOVE ACL SCREWS (Right Knee)  Patient Location: PACU  Anesthesia Type:MAC and Spinal  Level of Consciousness: awake, alert , oriented and patient cooperative  Airway & Oxygen Therapy: Patient Spontanous Breathing and Patient connected to face mask oxygen  Post-op Assessment: Report given to RN, Post -op Vital signs reviewed and stable and Patient moving all extremities  Post vital signs: Reviewed and stable  Last Vitals:  Vitals Value Taken Time  BP 124/91 09/15/2018  2:31 PM  Temp    Pulse 79 09/15/2018  2:33 PM  Resp 16 09/15/2018  2:33 PM  SpO2 100 % 09/15/2018  2:33 PM  Vitals shown include unvalidated device data.  Last Pain:  Vitals:   09/15/18 0954  TempSrc: Oral         Complications: No apparent anesthesia complications

## 2018-09-15 NOTE — Anesthesia Procedure Notes (Signed)
Anesthesia Regional Block: Adductor canal block   Pre-Anesthetic Checklist: ,, timeout performed, Correct Patient, Correct Site, Correct Laterality, Correct Procedure, Correct Position, site marked, Risks and benefits discussed,  Surgical consent,  Pre-op evaluation,  At surgeon's request and post-op pain management  Laterality: Right  Prep: chloraprep       Needles:  Injection technique: Single-shot  Needle Type: Echogenic Needle     Needle Length: 9cm      Additional Needles:   Procedures:,,,, ultrasound used (permanent image in chart),,,,  Narrative:  Start time: 09/15/2018 11:15 AM End time: 09/15/2018 11:23 AM Injection made incrementally with aspirations every 5 mL.  Performed by: Personally  Anesthesiologist: Myrtie Soman, MD  Additional Notes: Patient tolerated the procedure well without complications

## 2018-09-16 ENCOUNTER — Encounter (HOSPITAL_COMMUNITY): Payer: Self-pay | Admitting: Orthopedic Surgery

## 2018-09-16 DIAGNOSIS — K219 Gastro-esophageal reflux disease without esophagitis: Secondary | ICD-10-CM | POA: Diagnosis not present

## 2018-09-16 DIAGNOSIS — M1711 Unilateral primary osteoarthritis, right knee: Secondary | ICD-10-CM | POA: Diagnosis not present

## 2018-09-16 DIAGNOSIS — Z96651 Presence of right artificial knee joint: Secondary | ICD-10-CM | POA: Diagnosis not present

## 2018-09-16 DIAGNOSIS — R072 Precordial pain: Secondary | ICD-10-CM | POA: Diagnosis not present

## 2018-09-16 DIAGNOSIS — Z823 Family history of stroke: Secondary | ICD-10-CM | POA: Diagnosis not present

## 2018-09-16 DIAGNOSIS — Z79899 Other long term (current) drug therapy: Secondary | ICD-10-CM | POA: Diagnosis not present

## 2018-09-16 DIAGNOSIS — Z888 Allergy status to other drugs, medicaments and biological substances status: Secondary | ICD-10-CM | POA: Diagnosis not present

## 2018-09-16 DIAGNOSIS — Z8249 Family history of ischemic heart disease and other diseases of the circulatory system: Secondary | ICD-10-CM | POA: Diagnosis not present

## 2018-09-16 DIAGNOSIS — I2510 Atherosclerotic heart disease of native coronary artery without angina pectoris: Secondary | ICD-10-CM | POA: Diagnosis not present

## 2018-09-16 DIAGNOSIS — I10 Essential (primary) hypertension: Secondary | ICD-10-CM | POA: Diagnosis not present

## 2018-09-16 DIAGNOSIS — I252 Old myocardial infarction: Secondary | ICD-10-CM | POA: Diagnosis not present

## 2018-09-16 DIAGNOSIS — D869 Sarcoidosis, unspecified: Secondary | ICD-10-CM | POA: Diagnosis not present

## 2018-09-16 DIAGNOSIS — E876 Hypokalemia: Secondary | ICD-10-CM | POA: Diagnosis not present

## 2018-09-16 DIAGNOSIS — Z836 Family history of other diseases of the respiratory system: Secondary | ICD-10-CM | POA: Diagnosis not present

## 2018-09-16 DIAGNOSIS — E785 Hyperlipidemia, unspecified: Secondary | ICD-10-CM | POA: Diagnosis not present

## 2018-09-16 DIAGNOSIS — Z7982 Long term (current) use of aspirin: Secondary | ICD-10-CM | POA: Diagnosis not present

## 2018-09-16 LAB — CBC
HCT: 41.8 % (ref 39.0–52.0)
Hemoglobin: 13.3 g/dL (ref 13.0–17.0)
MCH: 27.3 pg (ref 26.0–34.0)
MCHC: 31.8 g/dL (ref 30.0–36.0)
MCV: 85.7 fL (ref 80.0–100.0)
NRBC: 0 % (ref 0.0–0.2)
PLATELETS: 248 10*3/uL (ref 150–400)
RBC: 4.88 MIL/uL (ref 4.22–5.81)
RDW: 13.8 % (ref 11.5–15.5)
WBC: 13.5 10*3/uL — ABNORMAL HIGH (ref 4.0–10.5)

## 2018-09-16 LAB — BASIC METABOLIC PANEL
ANION GAP: 7 (ref 5–15)
BUN: 22 mg/dL — AB (ref 6–20)
CALCIUM: 8.4 mg/dL — AB (ref 8.9–10.3)
CO2: 25 mmol/L (ref 22–32)
CREATININE: 0.94 mg/dL (ref 0.61–1.24)
Chloride: 108 mmol/L (ref 98–111)
GFR calc Af Amer: >60 mL/min (ref >60)
Glucose, Bld: 141 mg/dL — ABNORMAL HIGH (ref 70–99)
Potassium: 4.3 mmol/L (ref 3.5–5.1)
Sodium: 140 mmol/L (ref 135–145)

## 2018-09-16 MED ORDER — ASPIRIN EC 81 MG PO TBEC: 81.0000 mg | DELAYED_RELEASE_TABLET | Freq: Every day | ORAL | 2 refills | Status: AC

## 2018-09-16 MED ORDER — TIZANIDINE HCL 2 MG PO CAPS: 4.0000 mg | ORAL_CAPSULE | Freq: Three times a day (TID) | ORAL | 0 refills | Status: AC | PRN

## 2018-09-16 MED ORDER — OXYCODONE HCL 5 MG PO TABS: 5.0000 mg | ORAL_TABLET | ORAL | 0 refills | Status: AC | PRN

## 2018-09-16 NOTE — Discharge Instructions (Signed)

## 2018-09-16 NOTE — Progress Notes (Signed)
PATIENT ID: Bryan Torres  MRN: [id number]  DOB/AGE:  [birth year] / 57 y.o.  1 Day Post-Op Procedure(s) (LRB): RIGHT TOTAL KNEE ARTHROPLASTY, REMOVE ACL SCREWS (Right)    PROGRESS NOTE Subjective: Patient is alert, oriented, no Nausea, no Vomiting, yes passing gas. Taking PO well. Denies SOB, Chest or Calf Pain. Using Incentive Spirometer, PAS in place. Ambulate 120', Patient reports pain as 1/10 .    Objective: Vital signs in last 24 hours: Vitals:   09/15/18 1838 09/15/18 2237 09/16/18 0204 09/16/18 0602  BP: (Abnormal) 113/92 127/85 121/84 120/82  Pulse: 74 74 64 (Abnormal) 59  Resp: 16 17 17 16   Temp: (Abnormal) 96 F (35.6 C) 98.2 F (36.8 C) 98 F (36.7 C) 98.3 F (36.8 C)  TempSrc:  Oral Oral Oral  SpO2: 97% 98% 97% 98%  Weight:      Height:          Intake/Output from previous day: I/O last 3 completed shifts: In: 2726.3 [I.V.:2126.3; IV OEHOZYYQM:250] Out: 1500 [Urine:1350; Blood:150]   Intake/Output this shift: Total I/O In: 740.3 [P.O.:460; I.V.:280.3] Out: 875 [Urine:875]   LABORATORY DATA: Recent Labs    09/16/18 0534  WBC 13.5*  HGB 13.3  HCT 41.8  PLT 248  NA 140  K 4.3  CL 108  CO2 25  BUN 22*  CREATININE 0.94  GLUCOSE 141*  CALCIUM 8.4*    Examination: Neurologically intact ABD soft Neurovascular intact Sensation intact distally Intact pulses distally Dorsiflexion/Plantar flexion intact Incision: moderate drainage No cellulitis present Compartment soft}  Assessment:   1 Day Post-Op Procedure(s) (LRB): RIGHT TOTAL KNEE ARTHROPLASTY, REMOVE ACL SCREWS (Right) ADDITIONAL DIAGNOSIS: Expected Acute Blood Loss Anemia, Hypertension, sarcoid  Patient's anticipated LOS is less than 2 midnights, meeting these requirements: - Younger than 66 - Lives within 1 hour of care - Has a competent adult at home to recover with post-op recover      Plan: PT/OT WBAT, AROM and PROM  DVT Prophylaxis:  SCDx72hrs, ASA 81 mg BID x 2  weeks DISCHARGE PLAN: Home, todat when passes PT, dressing change. DISCHARGE NEEDS: HHPT, Walker and 3-in-1 comode seat     Kerin Salen 09/16/2018, 7:00 AM

## 2018-09-16 NOTE — Progress Notes (Signed)
Physical Therapy Treatment Patient Details Name: Bryan Torres MRN: [id number] DOB: 11-15-1961 Today's Date: 09/16/2018    History of Present Illness R TKA, H/OSTEMI    PT Comments    POD # 1 pm session Spouse present during session.  Assisted with amb a greater distance and practiced stairs.   Addressed all mobility questions.  Pt ready for D/C to home.    Follow Up Recommendations  Follow surgeon's recommendation for DC plan and follow-up therapies;Home health PT     Equipment Recommendations  None recommended by PT    Recommendations for Other Services       Precautions / Restrictions Precautions Precautions: Knee Restrictions Weight Bearing Restrictions: No Other Position/Activity Restrictions: WBAT    Mobility  Bed Mobility               General bed mobility comments: OOB in recliner  Transfers Overall transfer level: Needs assistance Equipment used: Rolling walker (2 wheeled) Transfers: Sit to/from Stand;Stand Pivot Transfers Sit to Stand: Supervision;Min guard Stand pivot transfers: Supervision;Min guard       General transfer comment: cues for hand and right leg position  Ambulation/Gait Ambulation/Gait assistance: Supervision;Min guard Gait Distance (Feet): 155 Feet Assistive device: Rolling walker (2 wheeled) Gait Pattern/deviations: Step-through pattern Gait velocity: decreased   General Gait Details: 25% VC's on proper upright posture and safety with turns   Stairs Stairs: Yes Stairs assistance: Supervision;Min guard Stair Management: No rails;Step to pattern;Forwards Number of Stairs: 10 General stair comments: with spouse present for education pt practiced multiple steps   Wheelchair Mobility    Modified Rankin (Stroke Patients Only)       Balance                                            Cognition Arousal/Alertness: Awake/alert Behavior During Therapy: WFL for tasks assessed/performed Overall  Cognitive Status: Within Functional Limits for tasks assessed                                        Exercises      General Comments        Pertinent Vitals/Pain Pain Assessment: 0-10 Pain Score: 4  Pain Location: right knee Pain Descriptors / Indicators: Discomfort;Tightness;Tender;Operative site guarding Pain Intervention(s): Premedicated before session;Monitored during session;Repositioned;Ice applied    Home Living                      Prior Function            PT Goals (current goals can now be found in the care plan section) Progress towards PT goals: Progressing toward goals    Frequency    7X/week      PT Plan Current plan remains appropriate    Co-evaluation              AM-PAC PT "6 Clicks" Daily Activity  Outcome Measure    Difficulty moving from lying on back to sitting on the side of the bed? : A Lot Difficulty sitting down on and standing up from a chair with arms (e.g., wheelchair, bedside commode, etc,.)?: A Lot Help needed moving to and from a bed to chair (including a wheelchair)?: A Little Help needed walking in hospital room?: A Little Help needed climbing 3-5  steps with a railing? : A Lot 6 Click Score: 12    End of Session Equipment Utilized During Treatment: Gait belt Activity Tolerance: Patient tolerated treatment well Patient left: in chair;with call bell/phone within reach;with chair alarm set;with family/visitor present   PT Visit Diagnosis: Unsteadiness on feet (R26.81);Pain Pain - Right/Left: Right Pain - part of body: Knee     Time: 1350-1415 PT Time Calculation (min) (ACUTE ONLY): 25 min  Charges:  $Gait Training: 8-22 mins $Therapeutic Activity: 8-22 mins                     {Zamarion Longest  PTA Acute  Rehabilitation Services Pager      [phone] Office      [phone]

## 2018-09-16 NOTE — Progress Notes (Signed)
Physical Therapy Treatment Patient Details Name: Bryan Torres MRN: [id number] DOB: [birth year] Today's Date: 09/16/2018    History of Present Illness R TKA, H/OSTEMI    PT Comments    POD # 1 am session Assisted with amb a greater distance in hallway.  Performed all supine TKR TE's following HEP handout.  Instructed on proper tech, freq as well as use of ICE.    Follow Up Recommendations  Follow surgeon's recommendation for DC plan and follow-up therapies;Home health PT     Equipment Recommendations  None recommended by PT    Recommendations for Other Services       Precautions / Restrictions Precautions Precautions: Knee Restrictions Weight Bearing Restrictions: No Other Position/Activity Restrictions: WBAT    Mobility  Bed Mobility               General bed mobility comments: OOB in recliner  Transfers Overall transfer level: Needs assistance Equipment used: Rolling walker (2 wheeled) Transfers: Sit to/from Stand;Stand Pivot Transfers Sit to Stand: Supervision;Min guard Stand pivot transfers: Supervision;Min guard       General transfer comment: cues for hand and right leg position  Ambulation/Gait Ambulation/Gait assistance: Supervision;Min guard Gait Distance (Feet): 135 Feet Assistive device: Rolling walker (2 wheeled) Gait Pattern/deviations: Step-through pattern Gait velocity: decreased   General Gait Details: 25% VC's on proper upright posture and safety with turns   Chief Strategy Officer    Modified Rankin (Stroke Patients Only)       Balance                                            Cognition Arousal/Alertness: Awake/alert Behavior During Therapy: WFL for tasks assessed/performed Overall Cognitive Status: Within Functional Limits for tasks assessed                                        Exercises   Total Knee Replacement TE's 10 reps B LE ankle pumps 10 reps  towel squeezes 10 reps knee presses 10 reps heel slides  10 reps SAQ's 10 reps SLR's 10 reps ABD Followed by ICE     General Comments        Pertinent Vitals/Pain Pain Assessment: 0-10 Pain Score: 4  Pain Location: right knee Pain Descriptors / Indicators: Discomfort;Tightness;Tender;Operative site guarding Pain Intervention(s): Premedicated before session;Monitored during session;Repositioned;Ice applied    Home Living                      Prior Function            PT Goals (current goals can now be found in the care plan section) Progress towards PT goals: Progressing toward goals    Frequency    7X/week      PT Plan Current plan remains appropriate    Co-evaluation              AM-PAC PT "6 Clicks" Daily Activity  Outcome Measure    Difficulty moving from lying on back to sitting on the side of the bed? : A Lot Difficulty sitting down on and standing up from a chair with arms (e.g., wheelchair, bedside commode, etc,.)?: A Lot Help needed moving to and from a  bed to chair (including a wheelchair)?: A Little Help needed walking in hospital room?: A Little Help needed climbing 3-5 steps with a railing? : A Lot 6 Click Score: 12    End of Session Equipment Utilized During Treatment: Gait belt Activity Tolerance: Patient tolerated treatment well Patient left: in chair;with call bell/phone within reach;with chair alarm set;with family/visitor present   PT Visit Diagnosis: Unsteadiness on feet (R26.81);Pain Pain - Right/Left: Right Pain - part of body: Knee     Time: 1035-1105 PT Time Calculation (min) (ACUTE ONLY): 30 min  Charges:  $Gait Training: 8-22 mins $Therapeutic Exercise: 8-22 mins                     Rica Koyanagi  PTA Acute  Rehabilitation Services Pager      [phone] Office      [phone]

## 2018-09-16 NOTE — Discharge Summary (Signed)
Patient ID: Bryan Torres MRN: [id number] DOB/AGE: 57-Jun-1962 57 y.o.  Admit date: 09/15/2018 Discharge date: 09/16/2018  Admission Diagnoses:  Active Problems:   Primary osteoarthritis of right knee   Discharge Diagnoses:  Same  Past Medical History:  Diagnosis Date  . CAD (coronary artery disease)    a. NSTEMI 01/2014 s/p DES-100% distal LCx  . GERD (gastroesophageal reflux disease)   . Hyperlipidemia   . Hypertension   . Hypokalemia   . NSTEMI (non-ST elevated myocardial infarction) ([place])   . Sarcoidosis of central nervous system     Surgeries: Procedure(s): RIGHT TOTAL KNEE ARTHROPLASTY, REMOVE ACL SCREWS on 09/15/2018   Consultants:   Discharged Condition: Improved  Hospital Course: Bryan Torres is an 57 y.o. male who was admitted 09/15/2018 for operative treatment of<principal problem not specified>. Patient has severe unremitting pain that affects sleep, daily activities, and work/hobbies. After pre-op clearance the patient was taken to the operating room on 09/15/2018 and underwent  Procedure(s): RIGHT TOTAL KNEE ARTHROPLASTY, REMOVE ACL SCREWS.    Patient was given perioperative antibiotics:  Anti-infectives (From admission, onward)   Start     Dose/Rate Route Frequency Ordered Stop   09/15/18 0830  ceFAZolin (ANCEF) IVPB 2g/100 mL premix     2 g 200 mL/hr over 30 Minutes Intravenous On call to O.R. 09/15/18 0254 09/15/18 1220       Patient was given sequential compression devices, early ambulation, and chemoprophylaxis to prevent DVT.  Patient benefited maximally from hospital stay and there were no complications.    Recent vital signs:  Patient Vitals for the past 24 hrs:  BP Temp Temp src Pulse Resp SpO2 Height Weight  09/16/18 0602 120/82 98.3 F (36.8 C) Oral (Abnormal) 59 16 98 % no documentation no documentation  09/16/18 0204 121/84 98 F (36.7 C) Oral 64 17 97 % no documentation no documentation  09/15/18 2237 127/85 98.2 F (36.8 C)  Oral 74 17 98 % no documentation no documentation  09/15/18 1838 (Abnormal) 113/92 (Abnormal) 96 F (35.6 C) no documentation 74 16 97 % no documentation no documentation  09/15/18 1754 134/89 no documentation no documentation 62 14 98 % no documentation no documentation  09/15/18 1645 121/90 no documentation Oral (Abnormal) 58 20 98 % no documentation no documentation  09/15/18 1529 124/85 98.3 F (36.8 C) Oral 62 no documentation 97 % no documentation no documentation  09/15/18 1500 117/88 (Abnormal) 97.5 F (36.4 C) no documentation 70 20 99 % no documentation no documentation  09/15/18 1445 117/88 no documentation no documentation 71 (Abnormal) 22 100 % no documentation no documentation  09/15/18 1430 (Abnormal) 124/91 (Abnormal) 97.4 F (36.3 C) no documentation 71 17 100 % no documentation no documentation  09/15/18 1128 no documentation no documentation no documentation 66 (Abnormal) 21 98 % no documentation no documentation  09/15/18 1126 (Abnormal) 126/95 no documentation no documentation 67 13 95 % no documentation no documentation  09/15/18 1125 no documentation no documentation no documentation 64 11 96 % no documentation no documentation  09/15/18 1124 no documentation no documentation no documentation 67 14 96 % no documentation no documentation  09/15/18 1123 (Abnormal) 134/102 no documentation no documentation 65 no documentation 97 % no documentation no documentation  09/15/18 1122 no documentation no documentation no documentation 66 12 98 % no documentation no documentation  09/15/18 1121 no documentation no documentation no documentation 63 (Abnormal) 9 99 % no documentation no documentation  09/15/18 1120 no documentation no documentation no documentation 66 17 99 %  no documentation no documentation  09/15/18 0955 no documentation no documentation no documentation no documentation no documentation no documentation 5\' 8"  (1.727 m) 92.4 kg  09/15/18 0954 (Abnormal) 147/96  (Abnormal) 97.5 F (36.4 C) Oral 73 18 100 % no documentation no documentation     Recent laboratory studies:  Recent Labs    09/16/18 0534  WBC 13.5*  HGB 13.3  HCT 41.8  PLT 248  NA 140  K 4.3  CL 108  CO2 25  BUN 22*  CREATININE 0.94  GLUCOSE 141*  CALCIUM 8.4*     Discharge Medications:   Allergies as of 09/16/2018    Allergen Reactions Comment   Amlodipine Other (See Comments) Double vision      Medication List    Stop taking these medications   methylPREDNISolone 4 MG Tbpk tablet Commonly known as:  MEDROL DOSEPAK     Take these medications   aspirin EC 81 MG tablet Take 1 tablet (81 mg total) by mouth daily.   atorvastatin 80 MG tablet Commonly known as:  LIPITOR Take 1 tablet (80 mg total) by mouth daily at 6 PM. Please call and schedule an appt for further refills, 1st attempt   hydrochlorothiazide 25 MG tablet Commonly known as:  HYDRODIURIL Take 1 tablet (25 mg total) by mouth daily.   oxyCODONE 5 MG immediate release tablet Commonly known as:  Oxy IR/ROXICODONE Take 1-2 tablets (5-10 mg total) by mouth every 4 (four) hours as needed for up to 7 days for severe pain.   potassium chloride SA 20 MEQ tablet Commonly known as:  K-DUR,KLOR-CON Take 20 mEq by mouth every evening.   predniSONE 5 MG tablet Commonly known as:  DELTASONE Take 20 mg by mouth daily with breakfast.   sildenafil 20 MG tablet Commonly known as:  REVATIO TAKE THREE TABLETS BY MOUTH ONCE DAILY AS NEEDED FOR  ERECTILE  DYSFUNCTION   tamsulosin 0.4 MG Caps capsule Commonly known as:  FLOMAX Take 0.4 mg by mouth daily at 3 pm.   tizanidine 2 MG capsule Commonly known as:  ZANAFLEX Take 2 capsules (4 mg total) by mouth 3 (three) times daily as needed for muscle spasms.   valsartan 320 MG tablet Commonly known as:  DIOVAN Take 320 mg by mouth daily.   VIAGRA 100 MG tablet Generic drug:  sildenafil Take 100 mg by mouth daily as needed for erectile dysfunction.         Durable Medical Equipment  (From admission, onward)         Start     Ordered   09/15/18 1541  DME Walker rolling  Once    Question:  Patient needs a walker to treat with the following condition  Answer:  Status post right knee replacement   09/15/18 1540   09/15/18 1541  DME 3 n 1  Once     09/15/18 1540          Diagnostic Studies: No results found.  Disposition: Discharge disposition: 01-Home or Self Care       Discharge Instructions    Call MD / Call 911   Complete by:  As directed    If you experience chest pain or shortness of breath, CALL 911 and be transported to the hospital emergency room.  If you develope a fever above 101 F, pus (white drainage) or increased drainage or redness at the wound, or calf pain, call your surgeon's office.   Constipation Prevention   Complete by:  As  directed    Drink plenty of fluids.  Prune juice may be helpful.  You may use a stool softener, such as Colace (over the counter) 100 mg twice a day.  Use MiraLax (over the counter) for constipation as needed.   Diet - low sodium heart healthy   Complete by:  As directed    Driving restrictions   Complete by:  As directed    No driving for 2 weeks   Increase activity slowly as tolerated   Complete by:  As directed       Follow-up Information    Frederik Pear, MD Follow up in 2 week(s).   Specialty:  Orthopedic Surgery Contact information: [place] [address] [phone]            Signed: Kerin Salen 09/16/2018, 7:13 AM

## 2018-09-16 NOTE — Progress Notes (Signed)
RN reviewed discharge instructions with patient and family. All questions answered.   Paperwork and prescriptions given.   In regards to dressing change: Dr. Mayer Camel changed dressing this AM, and replaced with a new ACE wrap, ordered for Aquacel to be placed.   Patient working with PT and dressing was saturated a second time. Dressing from Dr. Mayer Camel reinforced a second time with gauze and and ACE wrap.  Before discharge paperwork was reviewed, RN was going to place the Aquacel dressing as ordered. ACE wrap and gauze removed, and patient was bleeding from the distal part of his incision. Patient had compression applied for 9 hours, and therefore more gauze and ACE was placed back over the incision. Patient was given an Aquacel at discharge and instructed how to place the dressing, and not to shower until the drainage stopped. Also instructed patient and wife to call MD office if drainage continued tomorrow.    NT rolled patient down with all belongings to family car.

## 2018-09-16 NOTE — Care Management Note (Signed)
Case Management Note  Patient Details  Name: JOURDEN GILSON MRN: [id number] Date of Birth: [birth year]  Subjective/Objective:    Discharge planning, spoke with patient at beside. No preference for [facility] agency. Contacted the office and patient was prearranged with [facility].               Action/Plan: [facility] for referral, they are aware and have accepted. Has RW and 3-n-1. [phone]    Expected Discharge Date:  09/16/18               Expected Discharge Plan:  [place]  In-House Referral:  NA  Discharge planning Services  CM Consult  Post Acute Care Choice:  Home Health, NA Choice offered to:  Patient  DME Arranged:  N/A DME Agency:  NA  HH Arranged:  PT HH Agency:  [facility]  Status of Service:  Completed, signed off  If discussed at [place] of Stay Meetings, dates discussed:    Additional Comments:  Guadalupe Maple, RN 09/16/2018, 10:33 AM

## 2018-09-17 DIAGNOSIS — Z471 Aftercare following joint replacement surgery: Secondary | ICD-10-CM | POA: Diagnosis not present

## 2018-09-17 DIAGNOSIS — I10 Essential (primary) hypertension: Secondary | ICD-10-CM | POA: Diagnosis not present

## 2018-09-17 DIAGNOSIS — E785 Hyperlipidemia, unspecified: Secondary | ICD-10-CM | POA: Diagnosis not present

## 2018-09-17 DIAGNOSIS — Z96651 Presence of right artificial knee joint: Secondary | ICD-10-CM | POA: Diagnosis not present

## 2018-09-17 DIAGNOSIS — D8689 Sarcoidosis of other sites: Secondary | ICD-10-CM | POA: Diagnosis not present

## 2018-09-17 DIAGNOSIS — I2510 Atherosclerotic heart disease of native coronary artery without angina pectoris: Secondary | ICD-10-CM | POA: Diagnosis not present

## 2018-09-17 DIAGNOSIS — M1711 Unilateral primary osteoarthritis, right knee: Secondary | ICD-10-CM | POA: Diagnosis not present

## 2018-09-19 DIAGNOSIS — Z96651 Presence of right artificial knee joint: Secondary | ICD-10-CM | POA: Diagnosis not present

## 2018-09-19 DIAGNOSIS — D8689 Sarcoidosis of other sites: Secondary | ICD-10-CM | POA: Diagnosis not present

## 2018-09-19 DIAGNOSIS — E785 Hyperlipidemia, unspecified: Secondary | ICD-10-CM | POA: Diagnosis not present

## 2018-09-19 DIAGNOSIS — I2510 Atherosclerotic heart disease of native coronary artery without angina pectoris: Secondary | ICD-10-CM | POA: Diagnosis not present

## 2018-09-19 DIAGNOSIS — I10 Essential (primary) hypertension: Secondary | ICD-10-CM | POA: Diagnosis not present

## 2018-09-19 DIAGNOSIS — Z471 Aftercare following joint replacement surgery: Secondary | ICD-10-CM | POA: Diagnosis not present

## 2018-09-20 DIAGNOSIS — Z9889 Other specified postprocedural states: Secondary | ICD-10-CM | POA: Diagnosis not present

## 2018-09-23 DIAGNOSIS — Z96651 Presence of right artificial knee joint: Secondary | ICD-10-CM | POA: Diagnosis not present

## 2018-09-23 DIAGNOSIS — M1711 Unilateral primary osteoarthritis, right knee: Secondary | ICD-10-CM | POA: Diagnosis not present

## 2018-09-30 DIAGNOSIS — M1711 Unilateral primary osteoarthritis, right knee: Secondary | ICD-10-CM | POA: Diagnosis not present

## 2018-09-30 DIAGNOSIS — Z471 Aftercare following joint replacement surgery: Secondary | ICD-10-CM | POA: Diagnosis not present

## 2018-09-30 DIAGNOSIS — Z96651 Presence of right artificial knee joint: Secondary | ICD-10-CM | POA: Diagnosis not present

## 2018-10-04 DIAGNOSIS — M1711 Unilateral primary osteoarthritis, right knee: Secondary | ICD-10-CM | POA: Diagnosis not present

## 2018-10-06 DIAGNOSIS — Z96651 Presence of right artificial knee joint: Secondary | ICD-10-CM | POA: Diagnosis not present

## 2018-10-06 DIAGNOSIS — M25661 Stiffness of right knee, not elsewhere classified: Secondary | ICD-10-CM | POA: Diagnosis not present

## 2018-10-09 DIAGNOSIS — M25661 Stiffness of right knee, not elsewhere classified: Secondary | ICD-10-CM | POA: Diagnosis not present

## 2018-10-09 DIAGNOSIS — Z96651 Presence of right artificial knee joint: Secondary | ICD-10-CM | POA: Diagnosis not present

## 2018-10-14 DIAGNOSIS — Z79899 Other long term (current) drug therapy: Secondary | ICD-10-CM | POA: Diagnosis not present

## 2018-10-14 DIAGNOSIS — D8689 Sarcoidosis of other sites: Secondary | ICD-10-CM | POA: Diagnosis not present

## 2018-10-15 DIAGNOSIS — M25661 Stiffness of right knee, not elsewhere classified: Secondary | ICD-10-CM | POA: Diagnosis not present

## 2018-10-15 DIAGNOSIS — Z96651 Presence of right artificial knee joint: Secondary | ICD-10-CM | POA: Diagnosis not present

## 2018-10-21 DIAGNOSIS — Z471 Aftercare following joint replacement surgery: Secondary | ICD-10-CM | POA: Diagnosis not present

## 2018-10-21 DIAGNOSIS — Z96651 Presence of right artificial knee joint: Secondary | ICD-10-CM | POA: Diagnosis not present

## 2018-10-23 DIAGNOSIS — M25661 Stiffness of right knee, not elsewhere classified: Secondary | ICD-10-CM | POA: Diagnosis not present

## 2018-10-23 DIAGNOSIS — Z96651 Presence of right artificial knee joint: Secondary | ICD-10-CM | POA: Diagnosis not present

## 2018-10-24 DIAGNOSIS — Z96651 Presence of right artificial knee joint: Secondary | ICD-10-CM | POA: Diagnosis not present

## 2018-10-24 DIAGNOSIS — M25661 Stiffness of right knee, not elsewhere classified: Secondary | ICD-10-CM | POA: Diagnosis not present

## 2018-11-18 DIAGNOSIS — M25661 Stiffness of right knee, not elsewhere classified: Secondary | ICD-10-CM | POA: Diagnosis not present

## 2018-11-22 ENCOUNTER — Other Ambulatory Visit: Payer: Self-pay | Admitting: Internal Medicine

## 2018-11-24 NOTE — Telephone Encounter (Signed)
Refill Request.  

## 2019-03-20 DIAGNOSIS — D8689 Sarcoidosis of other sites: Secondary | ICD-10-CM | POA: Diagnosis not present

## 2019-04-08 DIAGNOSIS — D8689 Sarcoidosis of other sites: Secondary | ICD-10-CM | POA: Diagnosis not present

## 2019-05-04 DIAGNOSIS — I10 Essential (primary) hypertension: Secondary | ICD-10-CM | POA: Diagnosis not present

## 2019-05-25 DIAGNOSIS — I10 Essential (primary) hypertension: Secondary | ICD-10-CM | POA: Diagnosis not present

## 2019-05-25 DIAGNOSIS — R319 Hematuria, unspecified: Secondary | ICD-10-CM | POA: Diagnosis not present

## 2019-09-21 IMAGING — DX DG CHEST 2V
2 series · 2 of 2 positions shown · non-contrast
Comparison: Chest x-ray of 05/21/2014

CLINICAL DATA: Preop for knee replacement, history of hypertension

EXAM:
CHEST - 2 VIEW

[chest pa]
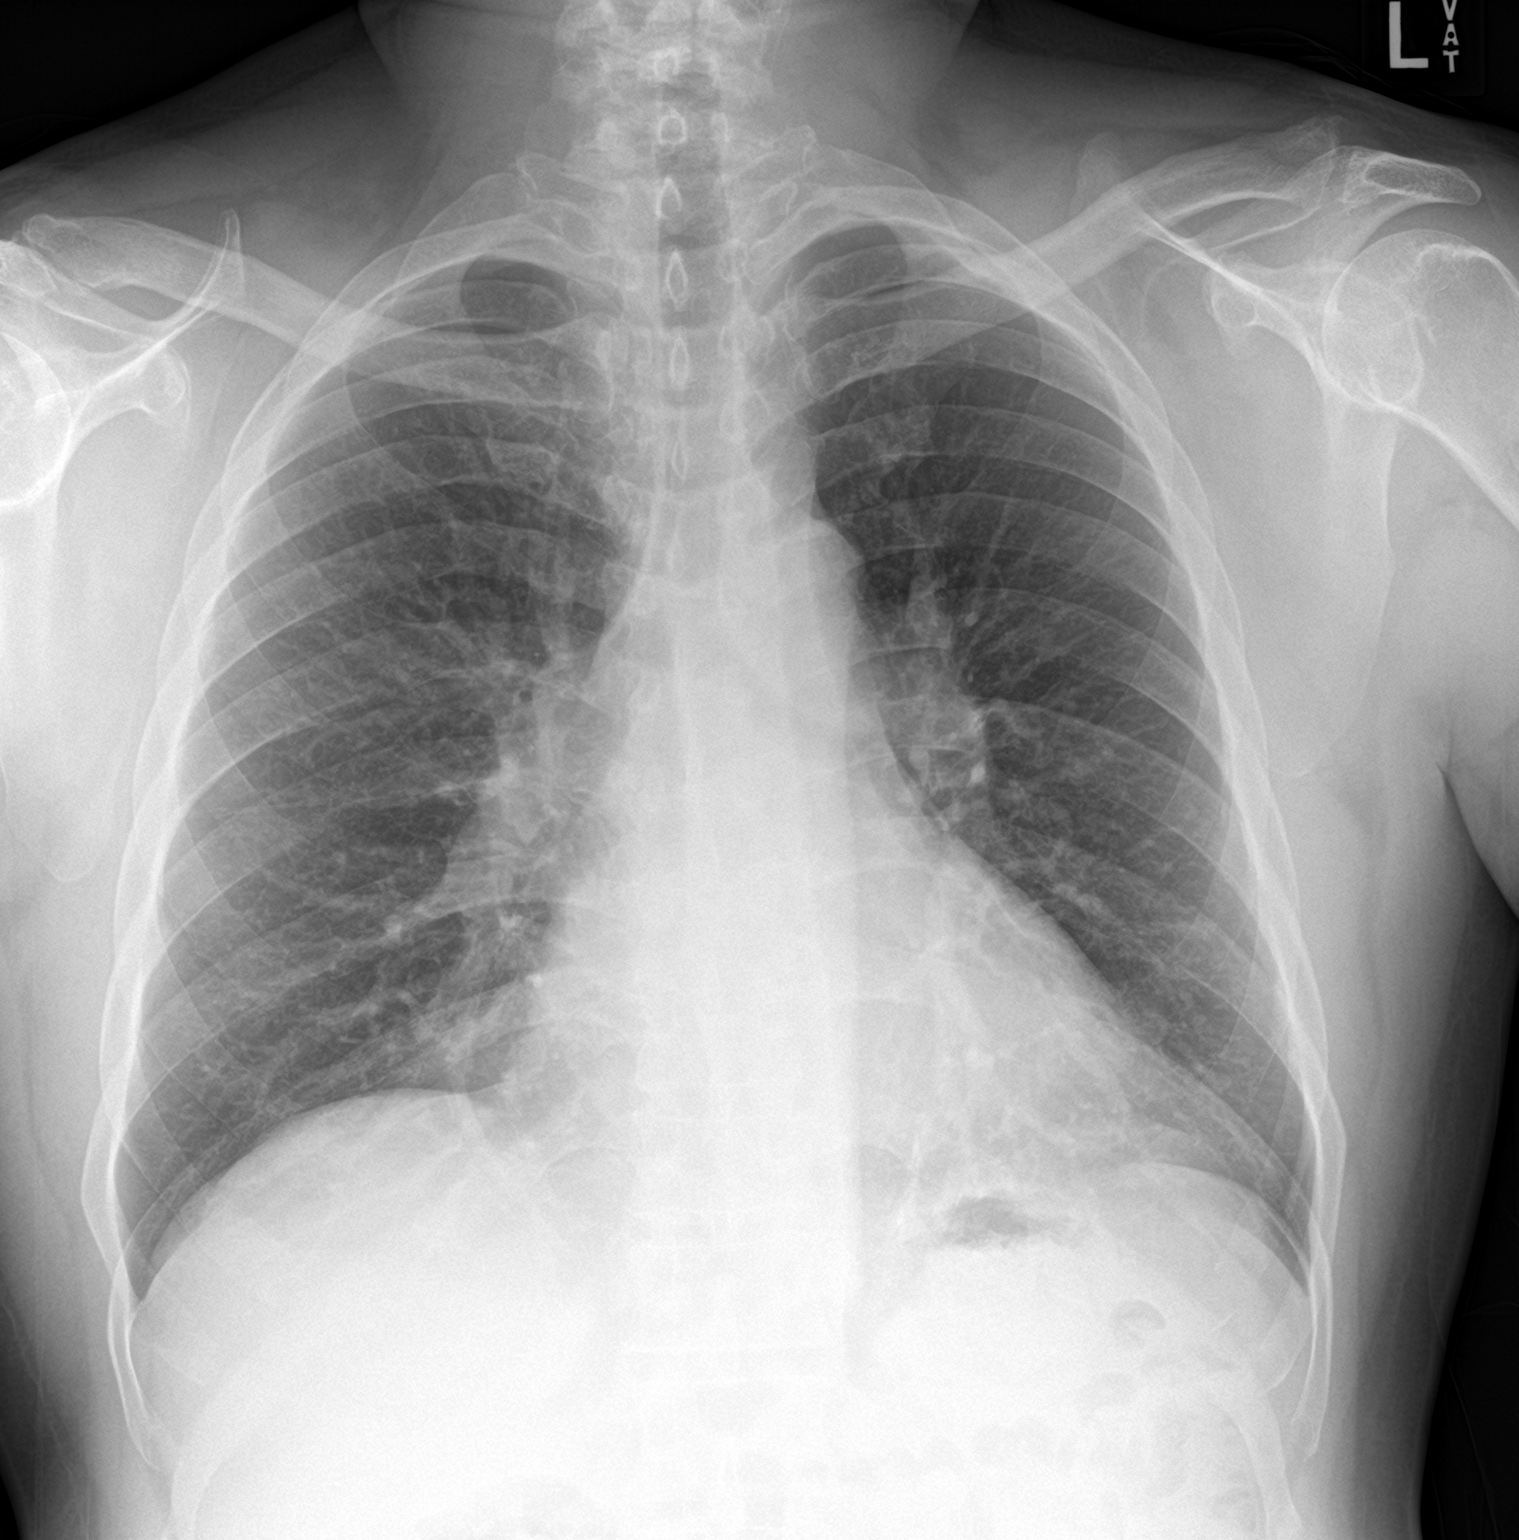

[chest lat]
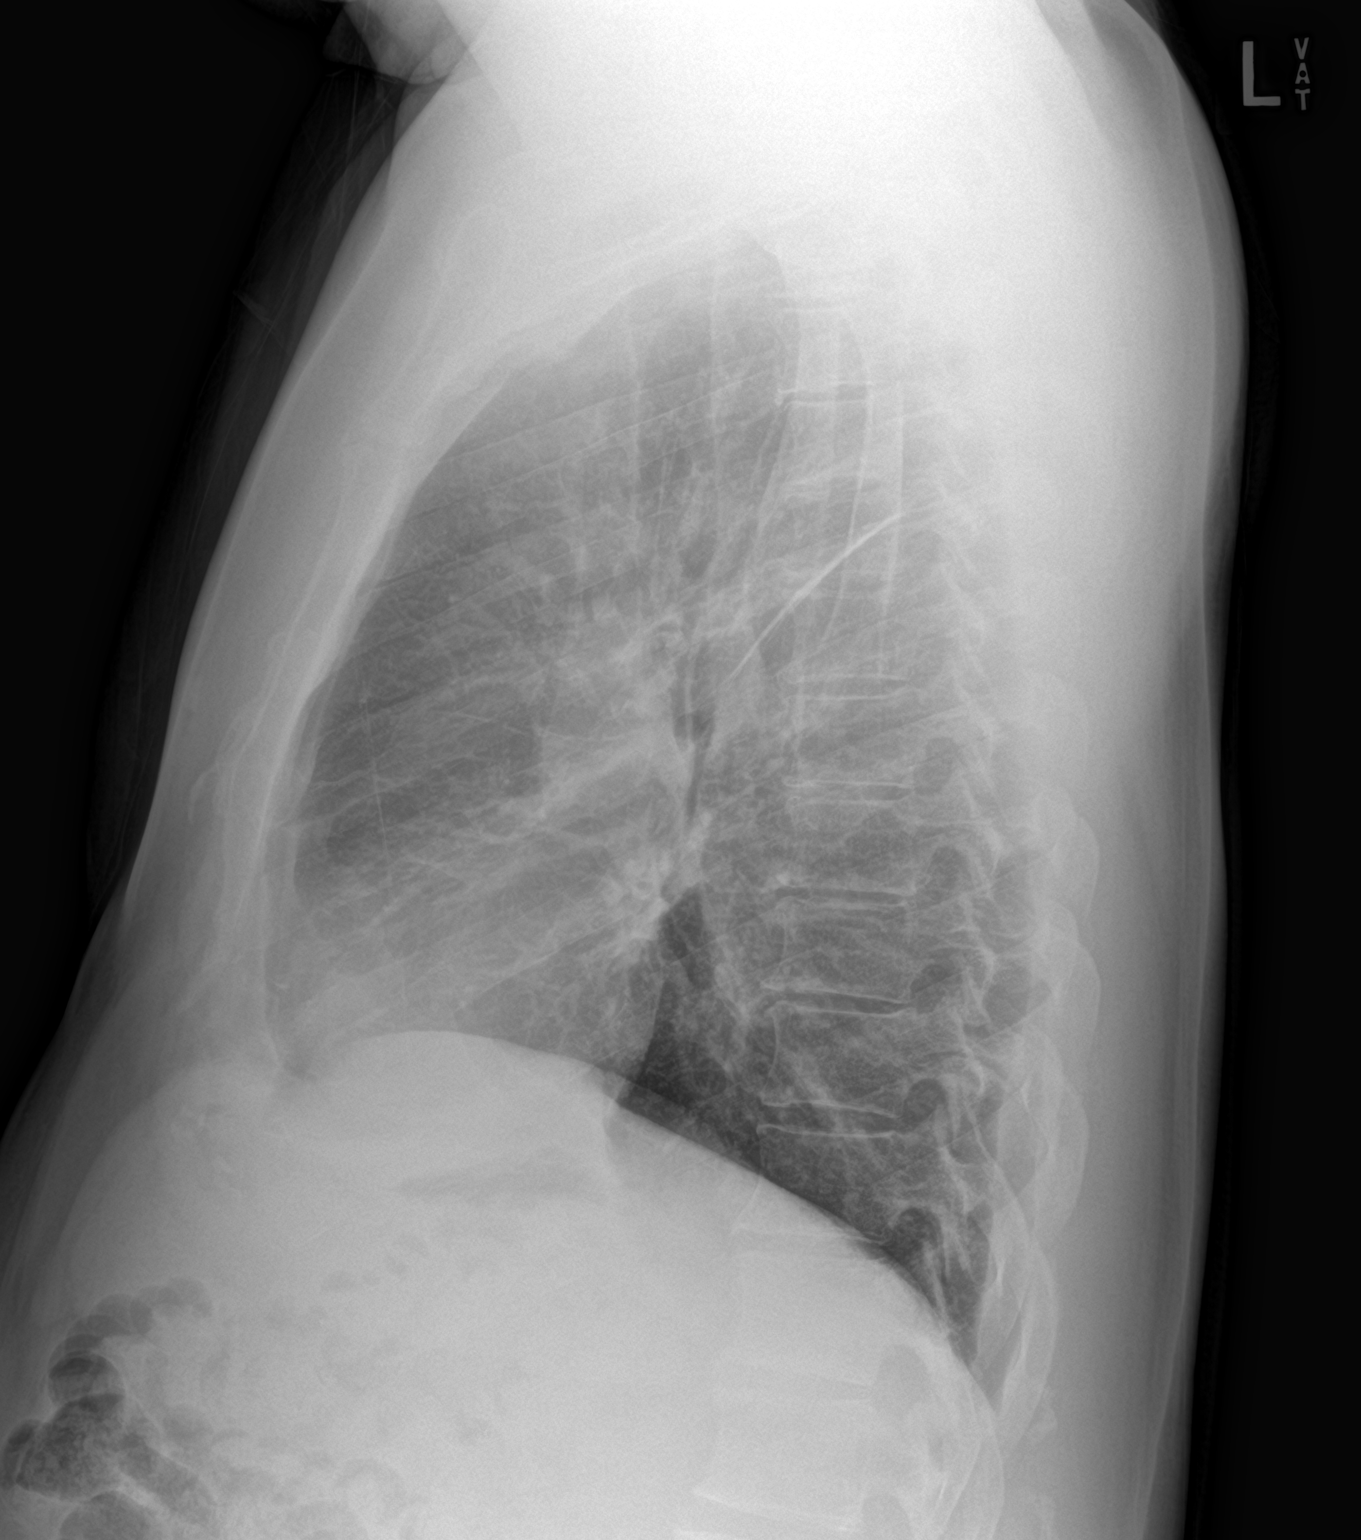

[2 of 2 positions shown; findings below may reference images not displayed]

FINDINGS: No active infiltrate or effusion is seen. Mediastinal and hilar
contours are unremarkable. The heart is within normal limits in
size. No bony abnormality is seen.
IMPRESSION: No active cardiopulmonary disease.

## 2019-12-18 ENCOUNTER — Telehealth: Payer: Self-pay | Admitting: Internal Medicine

## 2019-12-18 NOTE — Telephone Encounter (Signed)
Received forms from [place] in interoffice mail and sent to [facility]

## 2023-11-25 ENCOUNTER — Encounter (HOSPITAL_BASED_OUTPATIENT_CLINIC_OR_DEPARTMENT_OTHER): Payer: Self-pay | Admitting: Emergency Medicine

## 2023-11-25 ENCOUNTER — Emergency Department (HOSPITAL_BASED_OUTPATIENT_CLINIC_OR_DEPARTMENT_OTHER)
Admission: EM | Admit: 2023-11-25 | Discharge: 2023-11-25 | Disposition: A | Discharge status: Home / Self Care | Payer: Medicare Other | Attending: Emergency Medicine | Admitting: Emergency Medicine

## 2023-11-25 ENCOUNTER — Emergency Department (HOSPITAL_BASED_OUTPATIENT_CLINIC_OR_DEPARTMENT_OTHER): Payer: Medicare Other

## 2023-11-25 ENCOUNTER — Other Ambulatory Visit: Payer: Self-pay

## 2023-11-25 DIAGNOSIS — M25512 Pain in left shoulder: Secondary | ICD-10-CM | POA: Diagnosis present

## 2023-11-25 DIAGNOSIS — Y92410 Unspecified street and highway as the place of occurrence of the external cause: Secondary | ICD-10-CM | POA: Insufficient documentation

## 2023-11-25 DIAGNOSIS — M5030 Other cervical disc degeneration, unspecified cervical region: Secondary | ICD-10-CM | POA: Insufficient documentation

## 2023-11-25 DIAGNOSIS — S0990XA Unspecified injury of head, initial encounter: Secondary | ICD-10-CM | POA: Diagnosis present

## 2023-11-25 DIAGNOSIS — M545 Low back pain, unspecified: Secondary | ICD-10-CM | POA: Insufficient documentation

## 2023-11-25 DIAGNOSIS — R4182 Altered mental status, unspecified: Secondary | ICD-10-CM | POA: Diagnosis present

## 2023-11-25 DIAGNOSIS — M542 Cervicalgia: Secondary | ICD-10-CM

## 2023-11-25 MED ORDER — LIDOCAINE 5 % EX PTCH: 1.0000 | MEDICATED_PATCH | CUTANEOUS | 0 refills | Status: AC

## 2023-11-25 MED ORDER — PREDNISONE 20 MG PO TABS: 20.0000 mg | ORAL_TABLET | Freq: Every day | ORAL | 0 refills | Status: AC

## 2023-11-25 MED ORDER — HYDROCODONE-ACETAMINOPHEN 5-325 MG PO TABS
1.0000 | ORAL_TABLET | Freq: Once | ORAL | Status: AC
  Administered 2023-11-25: 1 via ORAL
  Filled 2023-11-25: qty 1

## 2023-11-25 MED ORDER — METHOCARBAMOL 500 MG PO TABS: 500.0000 mg | ORAL_TABLET | Freq: Two times a day (BID) | ORAL | 0 refills | Status: AC
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
## 2023-11-25 NOTE — Discharge Instructions (Signed)
